# Patient Record
Sex: Male | Born: 1995 | Race: White | Hispanic: No | Marital: Single | Smoking: Former smoker
Health system: Southern US, Community
[De-identification: ages and names within clinical notes are randomized; demographics above are authoritative.]

## PROBLEM LIST (undated history)

## (undated) DIAGNOSIS — T8859XA Other complications of anesthesia, initial encounter: Secondary | ICD-10-CM

## (undated) DIAGNOSIS — F419 Anxiety disorder, unspecified: Secondary | ICD-10-CM

## (undated) DIAGNOSIS — Z8489 Family history of other specified conditions: Secondary | ICD-10-CM

## (undated) DIAGNOSIS — F329 Major depressive disorder, single episode, unspecified: Secondary | ICD-10-CM

## (undated) DIAGNOSIS — T4145XA Adverse effect of unspecified anesthetic, initial encounter: Secondary | ICD-10-CM

## (undated) DIAGNOSIS — F32A Depression, unspecified: Secondary | ICD-10-CM

## (undated) DIAGNOSIS — K429 Umbilical hernia without obstruction or gangrene: Secondary | ICD-10-CM

## (undated) HISTORY — DX: Umbilical hernia without obstruction or gangrene: K42.9

---

## 2017-03-10 ENCOUNTER — Emergency Department
Admission: EM | Admit: 2017-03-10 | Discharge: 2017-03-10 | Disposition: A | Discharge status: Home / Self Care | Payer: Self-pay | Attending: Emergency Medicine | Admitting: Emergency Medicine

## 2017-03-10 ENCOUNTER — Emergency Department: Payer: Self-pay

## 2017-03-10 ENCOUNTER — Encounter: Payer: Self-pay | Admitting: Emergency Medicine

## 2017-03-10 DIAGNOSIS — R1033 Periumbilical pain: Secondary | ICD-10-CM | POA: Insufficient documentation

## 2017-03-10 DIAGNOSIS — R112 Nausea with vomiting, unspecified: Secondary | ICD-10-CM | POA: Insufficient documentation

## 2017-03-10 DIAGNOSIS — R61 Generalized hyperhidrosis: Secondary | ICD-10-CM | POA: Insufficient documentation

## 2017-03-10 DIAGNOSIS — K429 Umbilical hernia without obstruction or gangrene: Secondary | ICD-10-CM | POA: Insufficient documentation

## 2017-03-10 LAB — CBC
HCT: 49.1 % (ref 40.0–52.0)
Hemoglobin: 17.1 g/dL (ref 13.0–18.0)
MCH: 28 pg (ref 26.0–34.0)
MCHC: 34.8 g/dL (ref 32.0–36.0)
MCV: 80.5 fL (ref 80.0–100.0)
PLATELETS: 319 10*3/uL (ref 150–440)
RBC: 6.1 MIL/uL — AB (ref 4.40–5.90)
RDW: 13.2 % (ref 11.5–14.5)
WBC: 17.7 10*3/uL — ABNORMAL HIGH (ref 3.8–10.6)

## 2017-03-10 LAB — COMPREHENSIVE METABOLIC PANEL
ALK PHOS: 56 U/L (ref 38–126)
ALT: 34 U/L (ref 17–63)
ANION GAP: 10 (ref 5–15)
AST: 31 U/L (ref 15–41)
Albumin: 4.7 g/dL (ref 3.5–5.0)
BILIRUBIN TOTAL: 1.3 mg/dL — AB (ref 0.3–1.2)
BUN: 15 mg/dL (ref 6–20)
CALCIUM: 9.5 mg/dL (ref 8.9–10.3)
CO2: 25 mmol/L (ref 22–32)
CREATININE: 0.9 mg/dL (ref 0.61–1.24)
Chloride: 101 mmol/L (ref 101–111)
Glucose, Bld: 106 mg/dL — ABNORMAL HIGH (ref 65–99)
Potassium: 3.8 mmol/L (ref 3.5–5.1)
Sodium: 136 mmol/L (ref 135–145)
TOTAL PROTEIN: 8.1 g/dL (ref 6.5–8.1)

## 2017-03-10 LAB — URINALYSIS, COMPLETE (UACMP) WITH MICROSCOPIC
Bacteria, UA: NONE SEEN
Bilirubin Urine: NEGATIVE
Glucose, UA: NEGATIVE mg/dL
Hgb urine dipstick: NEGATIVE
KETONES UR: 20 mg/dL — AB
Leukocytes, UA: NEGATIVE
Nitrite: NEGATIVE
PH: 7 (ref 5.0–8.0)
Protein, ur: 30 mg/dL — AB
SQUAMOUS EPITHELIAL / LPF: NONE SEEN
Specific Gravity, Urine: 1.028 (ref 1.005–1.030)

## 2017-03-10 LAB — LIPASE, BLOOD: Lipase: 16 U/L (ref 11–51)

## 2017-03-10 MED ORDER — ONDANSETRON 4 MG PO TBDP: 4.0000 mg | ORAL_TABLET | Freq: Three times a day (TID) | ORAL | 0 refills | Status: DC | PRN

## 2017-03-10 MED ORDER — FENTANYL CITRATE (PF) 100 MCG/2ML IJ SOLN
50.0000 ug | Freq: Once | INTRAMUSCULAR | Status: DC
  Filled 2017-03-10: qty 2

## 2017-03-10 MED ORDER — SODIUM CHLORIDE 0.9 % IV BOLUS (SEPSIS)
1000.0000 mL | Freq: Once | INTRAVENOUS | Status: AC
  Administered 2017-03-10: 1000 mL via INTRAVENOUS

## 2017-03-10 MED ORDER — ONDANSETRON HCL 4 MG/2ML IJ SOLN
4.0000 mg | Freq: Once | INTRAMUSCULAR | Status: AC
  Administered 2017-03-10: 4 mg via INTRAVENOUS
  Filled 2017-03-10: qty 2

## 2017-03-10 MED ORDER — IOPAMIDOL (ISOVUE-300) INJECTION 61%
30.0000 mL | Freq: Once | INTRAVENOUS | Status: AC | PRN
  Administered 2017-03-10: 30 mL via ORAL

## 2017-03-10 MED ORDER — IOPAMIDOL (ISOVUE-300) INJECTION 61%
100.0000 mL | Freq: Once | INTRAVENOUS | Status: AC | PRN
  Administered 2017-03-10: 100 mL via INTRAVENOUS

## 2017-03-10 NOTE — Discharge Instructions (Signed)
Drink plenty of fluid to stay well-hydrated. You may take Tylenol or Motrin for pain. Zofran as for nausea.  Please make a follow-up appointment with Dr. Excell Seltzerooper, general surgeon, to have your hernia reevaluated. If you develop severe pain, nausea or vomiting, fever, or any other symptoms concerning to you, return to the emergency department.

## 2017-03-10 NOTE — ED Provider Notes (Signed)
[facility] Emergency Department Provider Note  ____________________________________________  Time seen: Approximately 1:05 PM  I have reviewed the triage vital signs and the nursing notes.   HISTORY  Chief Complaint Abdominal Pain    HPI Keith Davis is a 21 y.o. male otherwise healthy and without any history of abdominal surgery presenting with periumbilical pain, nausea and vomiting. The patient reports that he was on a ride on mower at 7:30 this morning when he developed a sharp pain just behind the umbilicus. Since then, he has had multiple similar episodes. He then became clammy, diaphoretic but denies constipation or diarrhea, dysuria, or pain of the scrotum, testicles or penis.  Tried Zofran without significant improvement.   History reviewed. No pertinent past medical history.  There are no active problems to display for this patient.   History reviewed. No pertinent surgical history.    Allergies Patient has no known allergies.  No family history on file.  Social History Social History  Substance Use Topics  . Smoking status: Never Smoker  . Smokeless tobacco: Never Used  . Alcohol use No    Review of Systems Constitutional: No fever/chills. No lightheadedness or syncope. Positive diaphoresis. Eyes: No visual changes. ENT: No sore throat. No congestion or rhinorrhea. Cardiovascular: Denies chest pain. Denies palpitations. Respiratory: Denies shortness of breath.  No cough. Gastrointestinal: positive periumbilical abdominal pain.  No nausea, no vomiting.  No diarrhea.  No constipation. Genitourinary: Negative for dysuria.no scrotal, testicular, or penile pain. Musculoskeletal: Negative for back pain. Skin: Negative for rash. Neurological: Negative for headaches. No focal numbness, tingling or weakness.     ____________________________________________   PHYSICAL EXAM:  VITAL SIGNS: ED Triage Vitals [03/10/17 1202]  Enc  Vitals Group     BP 119/71     Pulse Rate 71     Resp 15     Temp 98.2 F (36.8 C)     Temp Source Oral     SpO2 100 %     Weight 215 lb (97.5 kg)     Height 5\' 10"  (1.778 m)     Head Circumference      Peak Flow      Pain Score 5     Pain Loc      Pain Edu?      Excl. in GC?     Constitutional: Alert and oriented. Well appearing and in no acute distress. Answers questions appropriately. Eyes: Conjunctivae are normal.  EOMI. No scleral icterus. Head: Atraumatic. Nose: No congestion/rhinnorhea. Mouth/Throat: Mucous membranes are moist.  Neck: No stridor.  Supple.   Cardiovascular: Normal rate, regular rhythm. No murmurs, rubs or gallops.  Respiratory: Normal respiratory effort.  No accessory muscle use or retractions. Lungs CTAB.  No wheezes, rales or ronchi. Gastrointestinal: Soft, and nondistended.  Tender to palpation in the right lower quadrant greater than the right upper quadrant. No reproducible tenderness around the umbilicus.No guarding or rebound.  No peritoneal signs. Genitourinary: deferred as the patient denies symptoms Musculoskeletal: No LE edema.  Neurologic:  A&Ox3.  Speech is clear.  Face and smile are symmetric.  EOMI.  Moves all extremities well. Skin:  Skin is warm, dry and intact. No rash noted. Psychiatric: Mood and affect are normal. Speech and behavior are normal.  Normal judgement.  ____________________________________________   LABS (all labs ordered are listed, but only abnormal results are displayed)  Labs Reviewed  COMPREHENSIVE METABOLIC PANEL - Abnormal; Notable for the following:       Result Value  Glucose, Bld 106 (*)    Total Bilirubin 1.3 (*)    All other components within normal limits  CBC - Abnormal; Notable for the following:    WBC 17.7 (*)    RBC 6.10 (*)    All other components within normal limits  URINALYSIS, COMPLETE (UACMP) WITH MICROSCOPIC - Abnormal; Notable for the following:    Color, Urine YELLOW (*)    APPearance  CLEAR (*)    Ketones, ur 20 (*)    Protein, ur 30 (*)    All other components within normal limits  LIPASE, BLOOD   ____________________________________________  EKG  Not indicated ____________________________________________  RADIOLOGY  Ct Abdomen Pelvis W Contrast  Result Date: 03/10/2017 CLINICAL DATA:  Umbilical pain EXAM: CT ABDOMEN AND PELVIS WITH CONTRAST TECHNIQUE: Multidetector CT imaging of the abdomen and pelvis was performed using the standard protocol following bolus administration of intravenous contrast. CONTRAST:  ISOVUE-300 IOPAMIDOL (ISOVUE-300) INJECTION 61% COMPARISON:  None. FINDINGS: LOWER CHEST: Lung bases are clear. Included heart size is normal. No pericardial effusion. HEPATOBILIARY: Liver and gallbladder are normal. PANCREAS: Normal. SPLEEN: Normal. ADRENALS/URINARY TRACT: Kidneys are orthotopic, demonstrating symmetric enhancement. No nephrolithiasis, hydronephrosis or solid renal masses. The unopacified ureters are normal in course and caliber. Delayed imaging through the kidneys demonstrates symmetric prompt contrast excretion within the proximal urinary collecting system. Urinary bladder is partially distended and unremarkable. Normal adrenal glands. STOMACH/BOWEL: The stomach, small and large bowel are normal in course and caliber without inflammatory changes. Normal appendix. VASCULAR/LYMPHATIC: Aortoiliac vessels are normal in course and caliber. No lymphadenopathy by CT size criteria. REPRODUCTIVE: Normal. OTHER: No intraperitoneal free fluid or free air. Tiny umbilical fat containing hernia. MUSCULOSKELETAL: Nonacute. IMPRESSION: 1. No acute intraabdominal nor pelvic abnormality. 2. No bowel obstruction or inflammation. 3. Normal appendix. 4. Tiny fat containing umbilical hernia. Electronically Signed   By: Tollie Eth M.D.   On: 03/10/2017 14:32    ____________________________________________   PROCEDURES  Procedure(s) performed:  None  Procedures  Critical Care performed: No ____________________________________________   INITIAL IMPRESSION / ASSESSMENT AND PLAN / ED COURSE  Pertinent labs & imaging results that were available during my care of the patient were reviewed by me and considered in my medical decision making (see chart for details).  21 y.o. male, otherwise healthy, presenting with periumbilical pain, now in the right lower quadrant associated with nausea and vomiting.We'll give CT scan to rule out appendicitis. Consider gas,early GI infection, and rule out UTI. Plan to initiate symptomatic treament. Plan reevaluation for final disposition.  ----------------------------------------- 2:49 PM on 03/10/2017 -----------------------------------------  The patient's evaluation in the emergency department is overall reassuring. He does have an elevated white blood cell count, but symptomatically, he has significant improved. His pain has resolved and he is able to tolerate liquids without vomiting. His CT scan does not show appendicitis; he does have a small fat containing umbilical hernia but on reexamination he does not have any pain there and incarceration of the fat is very unlikely. I will plan to discharge him home with [facility] you follow-up. I've given him return precautions as well as follow-up instructions.  ____________________________________________  FINAL CLINICAL IMPRESSION(S) / ED DIAGNOSES  Final diagnoses:  Periumbilical pain  Non-intractable vomiting with nausea, unspecified vomiting type  Diaphoresis  Umbilical hernia without obstruction and without gangrene         NEW MEDICATIONS STARTED DURING THIS VISIT:  New Prescriptions   ONDANSETRON (ZOFRAN ODT) 4 MG DISINTEGRATING TABLET    Take 1  tablet (4 mg total) by mouth every 8 (eight) hours as needed for nausea or vomiting.      Rockne Menghini, MD 03/10/17 1451

## 2017-03-10 NOTE — ED Notes (Signed)
Visitor came to nurses desk stating pt finished contrast. CT notified.

## 2017-03-10 NOTE — ED Triage Notes (Signed)
Patient presents to ED via POV from work c/o umbilical pain. Patient states he was mowing grass when he developed the sudden pain. Patient also reports diarrhea. Heel drop negative for any pain.

## 2017-03-11 ENCOUNTER — Encounter: Payer: Self-pay | Admitting: Surgery

## 2017-03-11 ENCOUNTER — Ambulatory Visit (INDEPENDENT_AMBULATORY_CARE_PROVIDER_SITE_OTHER): Payer: Self-pay | Admitting: Surgery

## 2017-03-11 VITALS — BP 162/71 | HR 71 | Temp 98.3°F | Ht 70.0 in | Wt 215.0 lb

## 2017-03-11 DIAGNOSIS — K429 Umbilical hernia without obstruction or gangrene: Secondary | ICD-10-CM

## 2017-03-11 NOTE — Progress Notes (Signed)
Surgical Consultation  03/11/2017  Keith Davis is an 21 y.o. male.   Chief Complaint  Patient presents with  . Follow-up    Periumbilical pain     HPI: Keith Davis is a 21 year old male otherwise healthy seen in the ER yesterday and refer for evaluation after he presented with severe abdominal pain starting yesterday around the periumbilical area. The pain was constant and sharp in nature. The Patient Never Had Any Previous Operations. Now the Pa pain has improved. And the nausea has subsided. I have personally reviewed his CT scan of the abdomen and pelvis showing a small umbilical hernia defect. No evidence of bowel incarceration. No other acute intra-abdominal abnormalities. Lab work was reviewed and noted. White count was elevated at that time. No evidence of bowel obstruction. He is passing gas and taking by mouth  Past Medical History:  Diagnosis Date  . Periumbilical hernia     Past Surgical History:  Procedure Laterality Date  . NO PAST SURGERIES      Family History  Problem Relation Age of Onset  . Healthy Mother     Social History:  reports that he has never smoked. He has never used smokeless tobacco. He reports that he does not drink alcohol or use drugs.  Allergies: No Known Allergies  Medications reviewed.     ROS Full ROS performed and is otherwise negative other than what is stated in the HPI    BP (!) 162/71   Pulse 71   Temp 98.3 F (36.8 C) (Oral)   Ht _0  (1.778 m)   Wt 97.5 kg (215 lb)   BMI 30.85 kg/m   Physical Exam  Constitutional: He is oriented to person, place, and time and well-developed, well-nourished, and in no distress.  Eyes: Right eye exhibits no discharge. Left eye exhibits no discharge. No scleral icterus.  Neck: Normal range of motion. No JVD present. No tracheal deviation present.  Cardiovascular: Normal rate, regular rhythm and normal heart sounds.   Pulmonary/Chest: Effort normal. No stridor. No respiratory distress.  He has no wheezes.  Abdominal: Soft. He exhibits no distension and no mass. There is no rebound and no guarding.  Small umbilical hernia defect measured about 1.5 cm. There is tenderness to palpation in this area. No peritonitis  Musculoskeletal: Normal range of motion. He exhibits no edema.  Neurological: He is alert and oriented to person, place, and time. Gait normal. GCS score is 15.  Skin: Skin is warm and dry. He is not diaphoretic.  Psychiatric: Mood, memory, affect and judgment normal.  Nursing note and vitals reviewed.     Results for orders placed or performed during the hospital encounter of 03/10/17 (from the past 48 hour(s))  Lipase, blood     Status: None   Collection Time: 03/10/17 12:09 PM  Result Value Ref Range   Lipase 16 11 - 51 U/L  Comprehensive metabolic panel     Status: Abnormal   Collection Time: 03/10/17 12:09 PM  Result Value Ref Range   Sodium 136 135 - 145 mmol/L   Potassium 3.8 3.5 - 5.1 mmol/L   Chloride 101 101 - 111 mmol/L   CO2 25 22 - 32 mmol/L   Glucose, Bld 106 (H) 65 - 99 mg/dL   BUN 15 6 - 20 mg/dL   Creatinine, Ser 0.90 0.61 - 1.24 mg/dL   Calcium 9.5 8.9 - 10.3 mg/dL   Total Protein 8.1 6.5 - 8.1 g/dL   Albumin 4.7 3.5 - 5.0 g/dL  AST 31 15 - 41 U/L   ALT 34 17 - 63 U/L   Alkaline Phosphatase 56 38 - 126 U/L   Total Bilirubin 1.3 (H) 0.3 - 1.2 mg/dL   GFR calc non Af Amer >60 >60 mL/min   GFR calc Af Amer >60 >60 mL/min    Comment: (NOTE) The eGFR has been calculated using the CKD EPI equation. This calculation has not been validated in all clinical situations. eGFR's persistently <60 mL/min signify possible Chronic Kidney Disease.    Anion gap 10 5 - 15  CBC     Status: Abnormal   Collection Time: 03/10/17 12:09 PM  Result Value Ref Range   WBC 17.7 (H) 3.8 - 10.6 K/uL   RBC 6.10 (H) 4.40 - 5.90 MIL/uL   Hemoglobin 17.1 13.0 - 18.0 g/dL   HCT 49.1 40.0 - 52.0 %   MCV 80.5 80.0 - 100.0 fL   MCH 28.0 26.0 - 34.0 pg   MCHC  34.8 32.0 - 36.0 g/dL   RDW 13.2 11.5 - 14.5 %   Platelets 319 150 - 440 K/uL  Urinalysis, Complete w Microscopic     Status: Abnormal   Collection Time: 03/10/17 12:09 PM  Result Value Ref Range   Color, Urine YELLOW (A) YELLOW   APPearance CLEAR (A) CLEAR   Specific Gravity, Urine 1.028 1.005 - 1.030   pH 7.0 5.0 - 8.0   Glucose, UA NEGATIVE NEGATIVE mg/dL   Hgb urine dipstick NEGATIVE NEGATIVE   Bilirubin Urine NEGATIVE NEGATIVE   Ketones, ur 20 (A) NEGATIVE mg/dL   Protein, ur 30 (A) NEGATIVE mg/dL   Nitrite NEGATIVE NEGATIVE   Leukocytes, UA NEGATIVE NEGATIVE   RBC / HPF 0-5 0 - 5 RBC/hpf   WBC, UA 0-5 0 - 5 WBC/hpf   Bacteria, UA NONE SEEN NONE SEEN   Squamous Epithelial / LPF NONE SEEN NONE SEEN   Mucus PRESENT    Ct Abdomen Pelvis W Contrast  Result Date: 03/10/2017 CLINICAL DATA:  Umbilical pain EXAM: CT ABDOMEN AND PELVIS WITH CONTRAST TECHNIQUE: Multidetector CT imaging of the abdomen and pelvis was performed using the standard protocol following bolus administration of intravenous contrast. CONTRAST:  138m ISOVUE-300 IOPAMIDOL (ISOVUE-300) INJECTION 61% COMPARISON:  None. FINDINGS: LOWER CHEST: Lung bases are clear. Included heart size is normal. No pericardial effusion. HEPATOBILIARY: Liver and gallbladder are normal. PANCREAS: Normal. SPLEEN: Normal. ADRENALS/URINARY TRACT: Kidneys are orthotopic, demonstrating symmetric enhancement. No nephrolithiasis, hydronephrosis or solid renal masses. The unopacified ureters are normal in course and caliber. Delayed imaging through the kidneys demonstrates symmetric prompt contrast excretion within the proximal urinary collecting system. Urinary bladder is partially distended and unremarkable. Normal adrenal glands. STOMACH/BOWEL: The stomach, small and large bowel are normal in course and caliber without inflammatory changes. Normal appendix. VASCULAR/LYMPHATIC: Aortoiliac vessels are normal in course and caliber. No lymphadenopathy by  CT size criteria. REPRODUCTIVE: Normal. OTHER: No intraperitoneal free fluid or free air. Tiny umbilical fat containing hernia. MUSCULOSKELETAL: Nonacute. IMPRESSION: 1. No acute intraabdominal nor pelvic abnormality. 2. No bowel obstruction or inflammation. 3. Normal appendix. 4. Tiny fat containing umbilical hernia. Electronically Signed   By: DAshley RoyaltyM.D.   On: 03/10/2017 14:32    Assessment/Plan: 1. Umbilical hernia without obstruction and without gangrene Symptomatic umbilical hernia on a 21year old otherwise healthy. Discussed with the patient in detail about his disease process. Given his symptoms I do recommend elective repair. Discussed with the patient in detail about the procedure. Risk, benefits and possible  complications including but not limited to: Bleeding, infection, recurrence. He understands and wishes to proceed. We will schedule him for an open umbilical hernia repair and possible mesh placement   Caroleen Hamman, MD [place] Surgeon

## 2017-03-11 NOTE — Patient Instructions (Signed)
Please look at your blue sheet in case you have any questions or concerns about your surgery. Remember that Dr. Everlene FarrierPabon will be doing your surgery on 03/25/2017 for a Laparoscopic umbilical hernia repair.

## 2017-03-12 ENCOUNTER — Telehealth: Payer: Self-pay | Admitting: Surgery

## 2017-03-12 NOTE — Telephone Encounter (Signed)
Patient has received all surgical information.

## 2017-03-12 NOTE — Telephone Encounter (Signed)
I have called patient to advise him of the surgery information below. No answer. I have left a message on voicemail.    pre op date/time and sx date. Sx: 03/25/17 with Dr Pabon--Open umbilical hernia repair possible mesh.  Pre op: 03/19/17 between 9-1:00pm--phone.   Patient made aware to call [phone], between 1-3:00pm the day before surgery, to find out what time to arrive.

## 2017-03-19 ENCOUNTER — Encounter
Admission: RE | Admit: 2017-03-19 | Discharge: 2017-03-19 | Disposition: A | Discharge status: Home / Self Care | Payer: Self-pay | Source: Ambulatory Visit | Attending: Surgery | Admitting: Surgery

## 2017-03-19 HISTORY — DX: Depression, unspecified: F32.A

## 2017-03-19 HISTORY — DX: Family history of other specified conditions: Z84.89

## 2017-03-19 HISTORY — DX: Anxiety disorder, unspecified: F41.9

## 2017-03-19 HISTORY — DX: Major depressive disorder, single episode, unspecified: F32.9

## 2017-03-19 NOTE — Patient Instructions (Signed)
  Your procedure is scheduled on: 03-25-17 Report to Same Day Surgery 2nd floor medical mall [facility](Medical Mall Entrance-take elevator on left to 2nd floor.  Check in with surgery information desk.) To find out your arrival time please call [phone] between 1PM - 3PM on 03-24-17  Remember: Instructions that are not followed completely may result in serious medical risk, up to and including death, or upon the discretion of your surgeon and anesthesiologist your surgery may need to be rescheduled.    _x___ 1. Do not eat food after midnight the night before your procedure. You may drink clear liquids up to 2 hours before you are scheduled to arrive at the hospital for your procedure.  Do not drink clear liquids within 2 hours of your scheduled arrival to the hospital.  Clear liquids include  --Water or Apple juice without pulp  --Clear carbohydrate beverage such as ClearFast or Gatorade  --Black Coffee or Clear Tea (No milk, no creamers, do not add anything to the coffee or Tea Type 1 and type 2 diabetics should only drink water.  No gum chewing or hard candies.     __x__ 2. No Alcohol for 24 hours before or after surgery.   __x__3. No Smoking for 24 prior to surgery.   ____  4. Bring all medications with you on the day of surgery if instructed.    __x__ 5. Notify your doctor if there is any change in your medical condition     (cold, fever, infections).     Do not wear jewelry, make-up, hairpins, clips or nail polish.  Do not wear lotions, powders, or perfumes. You may wear deodorant.  Do not shave 48 hours prior to surgery. Men may shave face and neck.  Do not bring valuables to the hospital.    [facility] belongings or valuables.               Contacts, dentures or bridgework may not be worn into surgery.  Leave your suitcase in the car. After surgery it may be brought to your room.  For patients admitted to the hospital, discharge time is determined by your  treatment team.   Patients discharged the day of surgery will not be allowed to drive home.  You will need someone to drive you home and stay with you the night of your procedure.    Please read over the following fact sheets that you were given:     ____ Take anti-hypertensive listed below, cardiac, seizure, asthma,     anti-reflux and psychiatric medicines. These include:  1. NONE  2.  3.  4.  5.  6.  ____Fleets enema or Magnesium Citrate as directed.   ____ Use CHG Soap or sage wipes as directed on instruction sheet   ____ Use inhalers on the day of surgery and bring to hospital day of surgery  ____ Stop Metformin and Janumet 2 days prior to surgery.    ____ Take 1/2 of usual insulin dose the night before surgery and none on the morning     surgery.   ____ Follow recommendations from Cardiologist, Pulmonologist or PCP regarding stopping Aspirin, Coumadin, Plavix ,Eliquis, Effient, or Pradaxa, and Pletal.  X____Stop Anti-inflammatories such as Advil, Aleve, Ibuprofen, Motrin, Naproxen, Naprosyn, Goodies powders or aspirin products NOW-OK to take Tylenol    ____ Stop supplements until after surgery.   ____ Bring C-Pap to the hospital.

## 2017-03-24 MED ORDER — CEFAZOLIN SODIUM-DEXTROSE 2-4 GM/100ML-% IV SOLN
2.0000 g | INTRAVENOUS | Status: AC
  Administered 2017-03-25: 2 g via INTRAVENOUS

## 2017-03-25 ENCOUNTER — Ambulatory Visit: Payer: Self-pay | Admitting: Anesthesiology

## 2017-03-25 ENCOUNTER — Encounter: Payer: Self-pay | Admitting: *Deleted

## 2017-03-25 ENCOUNTER — Ambulatory Visit
Admission: RE | Admit: 2017-03-25 | Discharge: 2017-03-25 | Disposition: A | Discharge status: Home / Self Care | Payer: Self-pay | Source: Ambulatory Visit | Attending: Surgery | Admitting: Surgery

## 2017-03-25 ENCOUNTER — Encounter
Admission: RE | Disposition: A | Discharge status: Home / Self Care | Payer: Self-pay | Source: Ambulatory Visit | Attending: Surgery

## 2017-03-25 DIAGNOSIS — K429 Umbilical hernia without obstruction or gangrene: Secondary | ICD-10-CM

## 2017-03-25 HISTORY — PX: UMBILICAL HERNIA REPAIR: SHX196

## 2017-03-25 SURGERY — REPAIR, HERNIA, UMBILICAL, ADULT
Anesthesia: General | Site: Abdomen | Wound class: Clean

## 2017-03-25 MED ORDER — BUPIVACAINE-EPINEPHRINE (PF) 0.25% -1:200000 IJ SOLN
INTRAMUSCULAR | Status: AC
  Filled 2017-03-25: qty 30

## 2017-03-25 MED ORDER — SUGAMMADEX SODIUM 200 MG/2ML IV SOLN
INTRAVENOUS | Status: AC
  Filled 2017-03-25: qty 2

## 2017-03-25 MED ORDER — FAMOTIDINE 20 MG PO TABS
ORAL_TABLET | ORAL | Status: AC
  Administered 2017-03-25: 20 mg via ORAL
  Filled 2017-03-25: qty 1

## 2017-03-25 MED ORDER — CHLORHEXIDINE GLUCONATE CLOTH 2 % EX PADS: 6.0000 | MEDICATED_PAD | Freq: Once | CUTANEOUS | Status: DC

## 2017-03-25 MED ORDER — DEXAMETHASONE SODIUM PHOSPHATE 10 MG/ML IJ SOLN
INTRAMUSCULAR | Status: DC | PRN
Start: 2017-03-25 — End: 2017-03-25
  Administered 2017-03-25: 10 mg via INTRAVENOUS

## 2017-03-25 MED ORDER — LACTATED RINGERS IV SOLN
INTRAVENOUS | Status: DC
  Administered 2017-03-25 (×2): via INTRAVENOUS

## 2017-03-25 MED ORDER — FAMOTIDINE 20 MG PO TABS
20.0000 mg | ORAL_TABLET | Freq: Once | ORAL | Status: AC
  Administered 2017-03-25: 20 mg via ORAL

## 2017-03-25 MED ORDER — ACETAMINOPHEN 10 MG/ML IV SOLN
INTRAVENOUS | Status: DC | PRN
  Administered 2017-03-25: 1000 mg via INTRAVENOUS

## 2017-03-25 MED ORDER — FENTANYL CITRATE (PF) 100 MCG/2ML IJ SOLN
INTRAMUSCULAR | Status: AC
  Filled 2017-03-25: qty 2

## 2017-03-25 MED ORDER — ROCURONIUM BROMIDE 50 MG/5ML IV SOLN
INTRAVENOUS | Status: AC
  Filled 2017-03-25: qty 1

## 2017-03-25 MED ORDER — KETOROLAC TROMETHAMINE 30 MG/ML IJ SOLN
INTRAMUSCULAR | Status: DC | PRN
  Administered 2017-03-25: 30 mg via INTRAVENOUS

## 2017-03-25 MED ORDER — ROCURONIUM BROMIDE 100 MG/10ML IV SOLN
INTRAVENOUS | Status: DC | PRN
  Administered 2017-03-25: 5 mg via INTRAVENOUS
  Administered 2017-03-25: 15 mg via INTRAVENOUS

## 2017-03-25 MED ORDER — HYDROCODONE-ACETAMINOPHEN 5-325 MG PO TABS: 1.0000 | ORAL_TABLET | Freq: Four times a day (QID) | ORAL | 0 refills | Status: DC | PRN

## 2017-03-25 MED ORDER — ONDANSETRON HCL 4 MG/2ML IJ SOLN: 4.0000 mg | Freq: Once | INTRAMUSCULAR | Status: DC | PRN

## 2017-03-25 MED ORDER — BUPIVACAINE-EPINEPHRINE 0.25% -1:200000 IJ SOLN
INTRAMUSCULAR | Status: DC | PRN
  Administered 2017-03-25: 30 mL

## 2017-03-25 MED ORDER — MIDAZOLAM HCL 2 MG/2ML IJ SOLN
INTRAMUSCULAR | Status: DC | PRN
  Administered 2017-03-25: 2 mg via INTRAVENOUS

## 2017-03-25 MED ORDER — FENTANYL CITRATE (PF) 100 MCG/2ML IJ SOLN
INTRAMUSCULAR | Status: DC | PRN
  Administered 2017-03-25 (×2): 100 ug via INTRAVENOUS

## 2017-03-25 MED ORDER — PROPOFOL 10 MG/ML IV BOLUS
INTRAVENOUS | Status: AC
  Filled 2017-03-25: qty 20

## 2017-03-25 MED ORDER — SUGAMMADEX SODIUM 200 MG/2ML IV SOLN
INTRAVENOUS | Status: DC | PRN
  Administered 2017-03-25: 200 mg via INTRAVENOUS

## 2017-03-25 MED ORDER — ONDANSETRON HCL 4 MG/2ML IJ SOLN
INTRAMUSCULAR | Status: DC | PRN
  Administered 2017-03-25: 4 mg via INTRAVENOUS

## 2017-03-25 MED ORDER — FENTANYL CITRATE (PF) 250 MCG/5ML IJ SOLN
INTRAMUSCULAR | Status: AC
  Filled 2017-03-25: qty 5

## 2017-03-25 MED ORDER — DEXAMETHASONE SODIUM PHOSPHATE 10 MG/ML IJ SOLN
INTRAMUSCULAR | Status: AC
  Filled 2017-03-25: qty 1

## 2017-03-25 MED ORDER — ONDANSETRON HCL 4 MG/2ML IJ SOLN
INTRAMUSCULAR | Status: AC
  Filled 2017-03-25: qty 2

## 2017-03-25 MED ORDER — FENTANYL CITRATE (PF) 100 MCG/2ML IJ SOLN: 25.0000 ug | INTRAMUSCULAR | Status: DC | PRN

## 2017-03-25 MED ORDER — MIDAZOLAM HCL 2 MG/2ML IJ SOLN
INTRAMUSCULAR | Status: AC
  Filled 2017-03-25: qty 2

## 2017-03-25 MED ORDER — PROPOFOL 500 MG/50ML IV EMUL
INTRAVENOUS | Status: AC
  Filled 2017-03-25: qty 50

## 2017-03-25 MED ORDER — SUCCINYLCHOLINE CHLORIDE 20 MG/ML IJ SOLN
INTRAMUSCULAR | Status: AC
  Filled 2017-03-25: qty 1

## 2017-03-25 MED ORDER — SUCCINYLCHOLINE CHLORIDE 20 MG/ML IJ SOLN
INTRAMUSCULAR | Status: DC | PRN
  Administered 2017-03-25: 120 mg via INTRAVENOUS

## 2017-03-25 MED ORDER — LIDOCAINE 2% (20 MG/ML) 5 ML SYRINGE
INTRAMUSCULAR | Status: DC | PRN
  Administered 2017-03-25: 100 mg via INTRAVENOUS

## 2017-03-25 MED ORDER — PROPOFOL 10 MG/ML IV BOLUS
INTRAVENOUS | Status: DC | PRN
  Administered 2017-03-25: 200 mg via INTRAVENOUS

## 2017-03-25 MED ORDER — CEFAZOLIN SODIUM-DEXTROSE 2-4 GM/100ML-% IV SOLN
INTRAVENOUS | Status: AC
  Filled 2017-03-25: qty 100

## 2017-03-25 SURGICAL SUPPLY — 22 items
APPLIER CLIP 11 MED OPEN (CLIP)
BLADE CLIPPER SURG (BLADE) ×3 IMPLANT
CANISTER SUCT 1200ML W/VALVE (MISCELLANEOUS) ×3 IMPLANT
CHLORAPREP W/TINT 26ML (MISCELLANEOUS) ×3 IMPLANT
CLIP APPLIE 11 MED OPEN (CLIP) IMPLANT
DERMABOND ADVANCED (GAUZE/BANDAGES/DRESSINGS) ×2
DERMABOND ADVANCED .7 DNX12 (GAUZE/BANDAGES/DRESSINGS) ×1 IMPLANT
DRAPE INCISE IOBAN 66X45 STRL (DRAPES) ×3 IMPLANT
DRAPE PED LAPAROTOMY (DRAPES) ×3 IMPLANT
DRSG TELFA 3X8 NADH (GAUZE/BANDAGES/DRESSINGS) IMPLANT
ELECT REM PT RETURN 9FT ADLT (ELECTROSURGICAL) ×3
ELECTRODE REM PT RTRN 9FT ADLT (ELECTROSURGICAL) ×1 IMPLANT
GLOVE BIO SURGEON STRL SZ7 (GLOVE) ×3 IMPLANT
GOWN STRL REUS W/ TWL LRG LVL3 (GOWN DISPOSABLE) ×2 IMPLANT
GOWN STRL REUS W/TWL LRG LVL3 (GOWN DISPOSABLE) ×4
NEEDLE HYPO 22GX1.5 SAFETY (NEEDLE) ×3 IMPLANT
NS IRRIG 500ML POUR BTL (IV SOLUTION) ×3 IMPLANT
PACK BASIN MINOR ARMC (MISCELLANEOUS) ×3 IMPLANT
SPONGE LAP 18X18 5 PK (GAUZE/BANDAGES/DRESSINGS) ×3 IMPLANT
SUT ETHIBOND NAB MO 7 #0 18IN (SUTURE) IMPLANT
SUT MNCRL AB 4-0 PS2 18 (SUTURE) ×3 IMPLANT
SYR 20CC LL (SYRINGE) ×3 IMPLANT

## 2017-03-25 NOTE — Op Note (Signed)
PROCEDURES: Primary repair of umbilical hernia  Pre-operative Diagnosis: Umbilical Hernia  Post-operative Diagnosis: Same  Surgeon: Merri Rayiego F Mika Griffitts    Anesthesia: General endotracheal anesthesia  ASA Class: 1   Surgeon: Sterling Bigiego Devita Nies , MD FACS  Anesthesia: Gen. with endotracheal tube  Findings: 1 cm UH reducible   Estimated Blood Loss: 5cc                  Complications: none                Condition: stable  Procedure Details  The patient was seen again in the Holding Room. The benefits, complications, treatment options, and expected outcomes were discussed with the patient. The risks of bleeding, infection, recurrence of symptoms, failure to resolve symptoms,  bowel injury, any of which could require further surgery were reviewed with the patient.   The patient was taken to Operating Room, identified as Diannia RuderSteven Flewellen and the procedure verified.  A Time Out was held and the above information confirmed.  Prior to the induction of general anesthesia, antibiotic prophylaxis was administered. VTE prophylaxis was in place. General endotracheal anesthesia was then administered and tolerated well. After the induction, the abdomen was prepped with Chloraprep and draped in the sterile fashion. The patient was positioned in the supine position.  The umbilical incision was created over the hernia and electrocautery was used to dissect through subcutaneous tissue. The hernia sac was dissected free from adjacent tissue and fascia. The hernia sac was entered and the sac was excised. Care was taken to avoid any injury to the bowel. The defect was repaired using interrupted 0 Ethibond sutures in the standard fashion. The subcutaneous tissue was closed with 3-0 Vicryl and skin was closed with a 4-0 Monocryl in a subcuticular fashion. Dermabond was used to coat the skin. Lidocaine 1% with epinephrine and Marcaine quarter percent was used to inject all the incision sites. Patient tolerated procedure  well and there were no immediate complications. Needle and laparotomy counts were correct  Sterling Bigiego Lucas Exline, MD, FACS

## 2017-03-25 NOTE — Anesthesia Procedure Notes (Signed)
Procedure Name: Intubation Date/Time: 03/25/2017 1:09 PM Performed by: Paulette BlanchPARAS, Moranda Billiot Pre-anesthesia Checklist: Patient identified, Patient being monitored, Timeout performed, Emergency Drugs available and Suction available Patient Re-evaluated:Patient Re-evaluated prior to induction Oxygen Delivery Method: Circle system utilized Preoxygenation: Pre-oxygenation with 100% oxygen Induction Type: IV induction Ventilation: Mask ventilation without difficulty Laryngoscope Size: 3 and Miller Grade View: Grade I Tube type: Oral Tube size: 7.5 mm Number of attempts: 1 Placement Confirmation: ETT inserted through vocal cords under direct vision,  positive ETCO2 and breath sounds checked- equal and bilateral Secured at: 21 cm Tube secured with: Tape Dental Injury: Teeth and Oropharynx as per pre-operative assessment

## 2017-03-25 NOTE — Anesthesia Post-op Follow-up Note (Signed)
Anesthesia QCDR form completed.        

## 2017-03-25 NOTE — Interval H&P Note (Signed)
History and Physical Interval Note:  03/25/2017 12:31 PM  Keith RuderSteven Gaw  has presented today for surgery, with the diagnosis of umbilical hernia  The various methods of treatment have been discussed with the patient and family. After consideration of risks, benefits and other options for treatment, the patient has consented to  Procedure(s): HERNIA REPAIR UMBILICAL ADULT (N/A) as a surgical intervention .  The patient's history has been reviewed, patient examined, no change in status, stable for surgery.  I have reviewed the patient's chart and labs.  Questions were answered to the patient's satisfaction.     Kaeleen Odom F Josiyah Tozzi

## 2017-03-25 NOTE — Anesthesia Preprocedure Evaluation (Signed)
Anesthesia Evaluation  Patient identified by MRN, date of birth, ID band Patient awake    Reviewed: Allergy & Precautions, H&P , NPO status , Patient's Chart, lab work & pertinent test results, reviewed documented beta blocker date and time   Airway Mallampati: II  TM Distance: >3 FB Neck ROM: full    Dental  (+) Teeth Intact   Pulmonary neg pulmonary ROS, former smoker,    Pulmonary exam normal        Cardiovascular negative cardio ROS Normal cardiovascular exam Rhythm:regular Rate:Normal     Neuro/Psych negative neurological ROS  negative psych ROS   GI/Hepatic negative GI ROS, Neg liver ROS,   Endo/Other  negative endocrine ROS  Renal/GU negative Renal ROS  negative genitourinary   Musculoskeletal   Abdominal   Peds  Hematology negative hematology ROS (+)   Anesthesia Other Findings Past Medical History: No date: Anxiety     Comment:  AS A CHILD No date: Depression     Comment:  AS A CHILD No date: Family history of adverse reaction to anesthesia     Comment:  PT'S SISTER- PER PT "IT TAKES ALOT OF ANESTHESIA TO PUT               HER TO SLEEP" AND SISTER HAS WOKEN UP DURING SURGERY-  PT              HAS ONLY HAD DENTAL PROCEDURES DONE AND IT TAKES A LOT OF              NOVACAINE TO NUMB PT No date: Periumbilical hernia Past Surgical History: No date: NO PAST SURGERIES BMI    Body Mass Index:  30.85 kg/m     Reproductive/Obstetrics negative OB ROS                             Anesthesia Physical Anesthesia Plan  ASA: II  Anesthesia Plan: General ETT   Post-op Pain Management:    Induction:   PONV Risk Score and Plan: 3 and Ondansetron, Dexamethasone, Midazolam and Propofol infusion  Airway Management Planned:   Additional Equipment:   Intra-op Plan:   Post-operative Plan:   Informed Consent: I have reviewed the patients History and Physical, chart, labs and  discussed the procedure including the risks, benefits and alternatives for the proposed anesthesia with the patient or authorized representative who has indicated his/her understanding and acceptance.   Dental Advisory Given  Plan Discussed with: CRNA  Anesthesia Plan Comments:         Anesthesia Quick Evaluation

## 2017-03-25 NOTE — Discharge Instructions (Signed)

## 2017-03-25 NOTE — H&P (View-Only) (Signed)
Surgical Consultation  03/11/2017  Keith Davis is an 21 y.o. male.   Chief Complaint  Patient presents with  . Follow-up    Periumbilical pain     HPI: Keith Davis is a 21-year-old male otherwise healthy seen in the ER yesterday and refer for evaluation after he presented with severe abdominal pain starting yesterday around the periumbilical area. The pain was constant and sharp in nature. The Patient Never Had Any Previous Operations. Now the Pa pain has improved. And the nausea has subsided. I have personally reviewed his CT scan of the abdomen and pelvis showing a small umbilical hernia defect. No evidence of bowel incarceration. No other acute intra-abdominal abnormalities. Lab work was reviewed and noted. White count was elevated at that time. No evidence of bowel obstruction. He is passing gas and taking by mouth  Past Medical History:  Diagnosis Date  . Periumbilical hernia     Past Surgical History:  Procedure Laterality Date  . NO PAST SURGERIES      Family History  Problem Relation Age of Onset  . Healthy Mother     Social History:  reports that he has never smoked. He has never used smokeless tobacco. He reports that he does not drink alcohol or use drugs.  Allergies: No Known Allergies  Medications reviewed.     ROS Full ROS performed and is otherwise negative other than what is stated in the HPI    BP (!) 162/71   Pulse 71   Temp 98.3 F (36.8 C) (Oral)   Ht 5' 10" (1.778 m)   Wt 97.5 kg (215 lb)   BMI 30.85 kg/m   Physical Exam  Constitutional: He is oriented to person, place, and time and well-developed, well-nourished, and in no distress.  Eyes: Right eye exhibits no discharge. Left eye exhibits no discharge. No scleral icterus.  Neck: Normal range of motion. No JVD present. No tracheal deviation present.  Cardiovascular: Normal rate, regular rhythm and normal heart sounds.   Pulmonary/Chest: Effort normal. No stridor. No respiratory distress.  He has no wheezes.  Abdominal: Soft. He exhibits no distension and no mass. There is no rebound and no guarding.  Small umbilical hernia defect measured about 1.5 cm. There is tenderness to palpation in this area. No peritonitis  Musculoskeletal: Normal range of motion. He exhibits no edema.  Neurological: He is alert and oriented to person, place, and time. Gait normal. GCS score is 15.  Skin: Skin is warm and dry. He is not diaphoretic.  Psychiatric: Mood, memory, affect and judgment normal.  Nursing note and vitals reviewed.     Results for orders placed or performed during the hospital encounter of 03/10/17 (from the past 48 hour(s))  Lipase, blood     Status: None   Collection Time: 03/10/17 12:09 PM  Result Value Ref Range   Lipase 16 11 - 51 U/L  Comprehensive metabolic panel     Status: Abnormal   Collection Time: 03/10/17 12:09 PM  Result Value Ref Range   Sodium 136 135 - 145 mmol/L   Potassium 3.8 3.5 - 5.1 mmol/L   Chloride 101 101 - 111 mmol/L   CO2 25 22 - 32 mmol/L   Glucose, Bld 106 (H) 65 - 99 mg/dL   BUN 15 6 - 20 mg/dL   Creatinine, Ser 0.90 0.61 - 1.24 mg/dL   Calcium 9.5 8.9 - 10.3 mg/dL   Total Protein 8.1 6.5 - 8.1 g/dL   Albumin 4.7 3.5 - 5.0 g/dL     AST 31 15 - 41 U/L   ALT 34 17 - 63 U/L   Alkaline Phosphatase 56 38 - 126 U/L   Total Bilirubin 1.3 (H) 0.3 - 1.2 mg/dL   GFR calc non Af Amer >60 >60 mL/min   GFR calc Af Amer >60 >60 mL/min    Comment: (NOTE) The eGFR has been calculated using the CKD EPI equation. This calculation has not been validated in all clinical situations. eGFR's persistently <60 mL/min signify possible Chronic Kidney Disease.    Anion gap 10 5 - 15  CBC     Status: Abnormal   Collection Time: 03/10/17 12:09 PM  Result Value Ref Range   WBC 17.7 (H) 3.8 - 10.6 K/uL   RBC 6.10 (H) 4.40 - 5.90 MIL/uL   Hemoglobin 17.1 13.0 - 18.0 g/dL   HCT 49.1 40.0 - 52.0 %   MCV 80.5 80.0 - 100.0 fL   MCH 28.0 26.0 - 34.0 pg   MCHC  34.8 32.0 - 36.0 g/dL   RDW 13.2 11.5 - 14.5 %   Platelets 319 150 - 440 K/uL  Urinalysis, Complete w Microscopic     Status: Abnormal   Collection Time: 03/10/17 12:09 PM  Result Value Ref Range   Color, Urine YELLOW (A) YELLOW   APPearance CLEAR (A) CLEAR   Specific Gravity, Urine 1.028 1.005 - 1.030   pH 7.0 5.0 - 8.0   Glucose, UA NEGATIVE NEGATIVE mg/dL   Hgb urine dipstick NEGATIVE NEGATIVE   Bilirubin Urine NEGATIVE NEGATIVE   Ketones, ur 20 (A) NEGATIVE mg/dL   Protein, ur 30 (A) NEGATIVE mg/dL   Nitrite NEGATIVE NEGATIVE   Leukocytes, UA NEGATIVE NEGATIVE   RBC / HPF 0-5 0 - 5 RBC/hpf   WBC, UA 0-5 0 - 5 WBC/hpf   Bacteria, UA NONE SEEN NONE SEEN   Squamous Epithelial / LPF NONE SEEN NONE SEEN   Mucus PRESENT    Ct Abdomen Pelvis W Contrast  Result Date: 03/10/2017 CLINICAL DATA:  Umbilical pain EXAM: CT ABDOMEN AND PELVIS WITH CONTRAST TECHNIQUE: Multidetector CT imaging of the abdomen and pelvis was performed using the standard protocol following bolus administration of intravenous contrast. CONTRAST:  100mL ISOVUE-300 IOPAMIDOL (ISOVUE-300) INJECTION 61% COMPARISON:  None. FINDINGS: LOWER CHEST: Lung bases are clear. Included heart size is normal. No pericardial effusion. HEPATOBILIARY: Liver and gallbladder are normal. PANCREAS: Normal. SPLEEN: Normal. ADRENALS/URINARY TRACT: Kidneys are orthotopic, demonstrating symmetric enhancement. No nephrolithiasis, hydronephrosis or solid renal masses. The unopacified ureters are normal in course and caliber. Delayed imaging through the kidneys demonstrates symmetric prompt contrast excretion within the proximal urinary collecting system. Urinary bladder is partially distended and unremarkable. Normal adrenal glands. STOMACH/BOWEL: The stomach, small and large bowel are normal in course and caliber without inflammatory changes. Normal appendix. VASCULAR/LYMPHATIC: Aortoiliac vessels are normal in course and caliber. No lymphadenopathy by  CT size criteria. REPRODUCTIVE: Normal. OTHER: No intraperitoneal free fluid or free air. Tiny umbilical fat containing hernia. MUSCULOSKELETAL: Nonacute. IMPRESSION: 1. No acute intraabdominal nor pelvic abnormality. 2. No bowel obstruction or inflammation. 3. Normal appendix. 4. Tiny fat containing umbilical hernia. Electronically Signed   By: David  Kwon M.D.   On: 03/10/2017 14:32    Assessment/Plan: 1. Umbilical hernia without obstruction and without gangrene Symptomatic umbilical hernia on a 21-year-old otherwise healthy. Discussed with the patient in detail about his disease process. Given his symptoms I do recommend elective repair. Discussed with the patient in detail about the procedure. Risk, benefits and possible   complications including but not limited to: Bleeding, infection, recurrence. He understands and wishes to proceed. We will schedule him for an open umbilical hernia repair and possible mesh placement   Nussen Pullin, MD FACS General Surgeon  

## 2017-03-25 NOTE — Transfer of Care (Signed)
Immediate Anesthesia Transfer of Care Note  Patient: Keith RuderSteven Barham  Procedure(s) Performed: Procedure(s): HERNIA REPAIR UMBILICAL ADULT (N/A)  Patient Location: PACU  Anesthesia Type:General  Level of Consciousness: awake, alert  and oriented  Airway & Oxygen Therapy: Patient Spontanous Breathing and Patient connected to face mask oxygen  Post-op Assessment: Report given to RN and Post -op Vital signs reviewed and stable  Post vital signs: Reviewed and stable  Last Vitals:  Vitals:   03/25/17 1146  BP: 119/64  Pulse: 65  Resp: 18  Temp: 36.7 C  SpO2: 99%    Last Pain:  Vitals:   03/25/17 1146  TempSrc: Oral  PainSc: 0-No pain         Complications: No apparent anesthesia complications

## 2017-03-25 NOTE — Anesthesia Procedure Notes (Signed)
Procedure Name: Intubation Date/Time: 03/25/2017 1:09 PM Performed by: Paulette BlanchPARAS, Codi Folkerts Pre-anesthesia Checklist: Patient identified, Patient being monitored, Timeout performed, Emergency Drugs available and Suction available Patient Re-evaluated:Patient Re-evaluated prior to induction Oxygen Delivery Method: Circle system utilized Preoxygenation: Pre-oxygenation with 100% oxygen Induction Type: IV induction Ventilation: Mask ventilation without difficulty Laryngoscope Size: 3 and Miller Grade View: Grade I Tube type: Oral Tube size: 7.5 mm Number of attempts: 1 Airway Equipment and Method: Stylet Placement Confirmation: ETT inserted through vocal cords under direct vision,  positive ETCO2 and breath sounds checked- equal and bilateral Secured at: 21 cm Tube secured with: Tape Dental Injury: Teeth and Oropharynx as per pre-operative assessment

## 2017-03-26 ENCOUNTER — Encounter: Payer: Self-pay | Admitting: Surgery

## 2017-03-29 NOTE — Anesthesia Postprocedure Evaluation (Signed)
Anesthesia Post Note  Patient: Keith Davis  Procedure(s) Performed: Procedure(s) (LRB): HERNIA REPAIR UMBILICAL ADULT (N/A)  Patient location during evaluation: PACU Anesthesia Type: General Level of consciousness: awake and alert Pain management: pain level controlled Vital Signs Assessment: post-procedure vital signs reviewed and stable Respiratory status: spontaneous breathing, nonlabored ventilation, respiratory function stable and patient connected to nasal cannula oxygen Cardiovascular status: blood pressure returned to baseline and stable Postop Assessment: no apparent nausea or vomiting Anesthetic complications: no     Last Vitals:  Vitals:   03/25/17 1443 03/25/17 1500  BP: (!) 111/58 108/61  Pulse: (!) 59   Resp: 20   Temp: 36.6 C   SpO2: 100%     Last Pain:  Vitals:   03/25/17 1443  TempSrc:   PainSc: 1                  Yevette Edwards

## 2017-04-08 ENCOUNTER — Ambulatory Visit (INDEPENDENT_AMBULATORY_CARE_PROVIDER_SITE_OTHER): Payer: Self-pay | Admitting: Surgery

## 2017-04-08 ENCOUNTER — Encounter: Payer: Self-pay | Admitting: Surgery

## 2017-04-08 VITALS — BP 127/80 | HR 80 | Temp 98.1°F | Ht 70.0 in | Wt 213.4 lb

## 2017-04-08 DIAGNOSIS — K429 Umbilical hernia without obstruction or gangrene: Secondary | ICD-10-CM

## 2017-04-08 NOTE — Progress Notes (Signed)
Surgical Clinic Progress/Follow-up Note   HPI:  21 y.o. Male presents to clinic for post-op follow-up evaluation 2 weeks s/p open primary repair of symptomatic umbilical hernia. Patient reports nearly complete resolution of his peri-incisional abdominal pain, for which he has not required narcotic pain medication, and describes +flatus and +BM WNL, denies N/V, fever/chills, CP, or SOB. He has also remained out of work at [employer] that requires routine heavy lifting.  Review of Systems:  Constitutional: denies any other weight loss, fever, chills, or sweats  Eyes: denies any other vision changes, history of eye injury  ENT: denies sore throat, hearing problems  Respiratory: denies shortness of breath, wheezing  Cardiovascular: denies chest pain, palpitations  Gastrointestinal: abdominal pain, N/V, and bowel function as per HPI Musculoskeletal: denies any other joint pains or cramps  Skin: Denies any other rashes or skin discolorations  Neurological: denies any other headache, dizziness, weakness  Psychiatric: denies any other depression, anxiety  All other review of systems: otherwise negative   Vital Signs:  BP 127/80   Pulse 80   Temp 98.1 F (36.7 C) (Oral)   Ht  (1.778 m)   Wt 213 lb 6.4 oz (96.8 kg)   BMI 30.62 kg/m    Physical Exam:  Constitutional:  -- Normal body habitus  -- Awake, alert, and oriented x3  Eyes:  -- Pupils equally round and reactive to light  -- No scleral icterus  Ear, nose, throat:  -- No jugular venous distension  -- No nasal drainage, bleeding Pulmonary:  -- No crackles -- Equal breath sounds bilaterally -- Breathing non-labored at rest Cardiovascular:  -- S1, S2 present  -- No pericardial rubs  Gastrointestinal:  -- Soft, nontender, non-distended, no guarding/rebound  -- Incision well-approximated without erythema or drainage, glue remains intact -- No abdominal masses appreciated, pulsatile or otherwise   Musculoskeletal / Integumentary:  -- Wounds or skin discoloration: None except post-surgical abdominal incision as above  -- Extremities: B/L UE and LE FROM, hands and feet warm, no edema  Neurologic:  -- Motor function: intact and symmetric  -- Sensation: intact and symmetric   Assessment:  21 y.o. yo Male with a problem list including...  Patient Active Problem List   Diagnosis Date Noted  . Umbilical hernia without obstruction and without gangrene     presents to clinic for post-op follow-up evaluation, doing well s/p open primary repair of symptomatic 1 cm umbilical hernia.  Plan:   - avoid heavy lifting >40 lbs x 4 more weeks   - okay to shower and submerge incision under water prn  - instructed to call office if any questions or concerns  - return to clinic as needed  All of the above recommendations were discussed with the patient and patient's wife, and all of patient's and family's questions were answered to his expressed satisfaction.  -- Scherrie Gerlach Earlene Plater, MD, RPVI [place]: [facility] General [facility] - Partnering for exceptional care. Office: [phone]

## 2017-04-08 NOTE — Patient Instructions (Signed)

## 2017-05-13 ENCOUNTER — Telehealth: Payer: Self-pay

## 2017-05-13 NOTE — Telephone Encounter (Signed)
Patient called wanting a letter to return to work on 05/17/2017 with no restrictions. Since patient had his surgery on 03/25/2017, he is able to return to work with no restrictions. I told patient that he is able to pick up the letter and that it would be left at the front desk for pick up. Patient agreed and had no further questions.

## 2018-07-18 ENCOUNTER — Ambulatory Visit (INDEPENDENT_AMBULATORY_CARE_PROVIDER_SITE_OTHER): Payer: Self-pay | Admitting: Surgery

## 2018-07-18 ENCOUNTER — Other Ambulatory Visit: Payer: Self-pay

## 2018-07-18 ENCOUNTER — Encounter: Payer: Self-pay | Admitting: *Deleted

## 2018-07-18 ENCOUNTER — Telehealth: Payer: Self-pay | Admitting: *Deleted

## 2018-07-18 ENCOUNTER — Encounter: Payer: Self-pay | Admitting: Surgery

## 2018-07-18 VITALS — BP 117/73 | HR 60 | Temp 99.3°F | Ht 70.0 in | Wt 218.6 lb

## 2018-07-18 DIAGNOSIS — K439 Ventral hernia without obstruction or gangrene: Secondary | ICD-10-CM

## 2018-07-18 NOTE — H&P (View-Only) (Signed)
Patient ID: Keith Davis, male   DOB: Jan 11, 1996, 23 y.o.   MRN: [id number]  HPI Keith Davis is a 23 y.o. male comes to the office for periumbilical pain that started at work while doing some strenuous activity. He reported severe pain that Friday . Sharp intermittent and on the periumbilical area, now feels a bulge. No fevers, chills, N/V. HE is able to perform more than 4 METS w/o sob or c/p. HE did have a hx UH repair primary 03/2017 and did well.   HPI  Past Medical History:  Diagnosis Date  . Anxiety    AS A CHILD  . Depression    AS A CHILD  . Family history of adverse reaction to anesthesia    PT'S SISTER- PER PT "IT TAKES ALOT OF ANESTHESIA TO PUT HER TO SLEEP" AND SISTER HAS WOKEN UP DURING SURGERY-  PT HAS ONLY HAD DENTAL PROCEDURES DONE AND IT TAKES A LOT OF NOVACAINE TO NUMB PT  . Periumbilical hernia     Past Surgical History:  Procedure Laterality Date  . NO PAST SURGERIES    . UMBILICAL HERNIA REPAIR N/A 03/25/2017   Procedure: HERNIA REPAIR UMBILICAL ADULT;  Surgeon: Leafy Ro, MD;  Location: [facility] ORS;  Service: General;  Laterality: N/A;    Family History  Problem Relation Age of Onset  . Healthy Mother     Social History Social History   Tobacco Use  . Smoking status: Former Smoker    Packs/day: 2.00    Years: 4.00    Pack years: 8.00    Last attempt to quit: 03/20/2015    Years since quitting: 3.3  . Smokeless tobacco: Never Used  Substance Use Topics  . Alcohol use: No  . Drug use: No    Allergies  Allergen Reactions  . Lactose Intolerance (Gi)     No current outpatient medications on file.   No current facility-administered medications for this visit.      Review of Systems Full ROS  was asked and was negative except for the information on the HPI  Physical Exam Blood pressure 117/73, pulse 60, temperature 99.3 F (37.4 C), temperature source Temporal, height 5\' 10"  (1.778 m), weight 218 lb 9.6 oz (99.2 kg), SpO2 96  %. CONSTITUTIONAL:NAD. EYES: Pupils are equal, round, and reactive to light, Sclera are non-icteric. EARS, NOSE, MOUTH AND THROAT: The oropharynx is clear. The oral mucosa is pink and moist. Hearing is intact to voice. LYMPH NODES:  Lymph nodes in the neck are normal. RESPIRATORY:  Lungs are clear. There is normal respiratory effort, with equal breath sounds bilaterally, and without pathologic use of accessory muscles. CARDIOVASCULAR: Heart is regular without murmurs, gallops, or rubs. GI: The abdomen is soft, mild TTP with a small epigastric hernia that is reducible but tender. Previous lower umbilical scar is well healed and there is no recurrence on the original site There are no palpable masses. There is no hepatosplenomegaly. There are normal bowel sounds in all quadrants. GU: Rectal deferred.   MUSCULOSKELETAL: Normal muscle strength and tone. No cyanosis or edema.   SKIN: Turgor is good and there are no pathologic skin lesions or ulcers. NEUROLOGIC: Motor and sensation is grossly normal. Cranial nerves are grossly intact. PSYCH:  Oriented to person, place and time. Affect is normal.  Data Reviewed  I have personally reviewed the patient's imaging, laboratory findings and medical records.    Assessment/Plan Ventral epigastric hernia vs recurrent UH liekly related to strenuous activity at work. Given  his symptoms I would recommend repair and mesh placement. I do think he is a good candidate for robotic ventral hernia repair. D/W the pt in detail about the procedure, risks, benefits and possible complications including but not limited to bleeding, infection, recurrence and bowel injuries. HE understands and wishes to proceed. We will plan for rob Ventral hernia repair.  Sterling Big, MD FACS General Surgeon 07/18/2018, 5:06 PM

## 2018-07-18 NOTE — Addendum Note (Signed)
Addended by: Sterling Big F on: 07/18/2018 05:13 PM   Modules accepted: Orders, SmartSet

## 2018-07-18 NOTE — Patient Instructions (Addendum)
Patient needs to be scheduled for surgery with Dr.Pabon. Will call the office after speaking with his supervisor.   Call the office with any questions or concerns.  Ventral Hernia  A ventral hernia is a bulge of tissue from inside the abdomen that pushes through a weak area of the muscles that form the front wall of the abdomen. The tissues inside the abdomen are inside a sac (peritoneum). These tissues include the small intestine, large intestine, and the fatty tissue that covers the intestines (omentum). Sometimes, the bulge that forms a hernia contains intestines. Other hernias contain only fat. Ventral hernias do not go away without surgical treatment. There are several types of ventral hernias. You may have:  A hernia at an incision site from previous abdominal surgery (incisional hernia).  A hernia just above the belly button (epigastric hernia), or at the belly button (umbilical hernia). These types of hernias can develop from heavy lifting or straining.  A hernia that comes and goes (reducible hernia). It may be visible only when you lift or strain. This type of hernia can be pushed back into the abdomen (reduced).  A hernia that traps abdominal tissue inside the hernia (incarcerated hernia). This type of hernia does not reduce.  A hernia that cuts off blood flow to the tissues inside the hernia (strangulated hernia). The tissues can start to die if this happens. This is a very painful bulge that cannot be reduced. A strangulated hernia is a medical emergency. What are the causes? This condition is caused by abdominal tissue putting pressure on an area of weakness in the abdominal muscles. What increases the risk? The following factors may make you more likely to develop this condition:  Being male.  Being 43 or older.  Being overweight or obese.  Having had previous abdominal surgery, especially if there was an infection after surgery.  Having had an injury to the abdominal  wall.  Having had several pregnancies.  Having a buildup of fluid inside the abdomen (ascites). What are the signs or symptoms? The only symptom of a ventral hernia may be a painless bulge in the abdomen. A reducible hernia may be visible only when you strain, cough, or lift. Other symptoms may include:  Dull pain.  A feeling of pressure. Signs and symptoms of a strangulated hernia may include:  Increasing pain.  Nausea and vomiting.  Pain when pressing on the hernia.  The skin over the hernia turning red or purple.  Constipation.  Blood in the stool (feces). How is this diagnosed? This condition may be diagnosed based on:  Your symptoms.  Your medical history.  A physical exam. You may be asked to cough or strain while standing. These actions increase the pressure inside your abdomen and force the hernia through the opening in your muscles. Your health care provider may try to reduce the hernia by pressing on it.  Imaging studies, such as an ultrasound or CT scan. How is this treated? This condition is treated with surgery. If you have a strangulated hernia, surgery is done as soon as possible. If your hernia is small and not incarcerated, you may be asked to lose some weight before surgery. Follow these instructions at home:  Follow instructions from your health care provider about eating or drinking restrictions.  If you are overweight, your health care provider may recommend that you increase your activity level and eat a healthier diet.  Do not lift anything that is heavier than 10 lb (4.5 kg).  Return to your normal activities as told by your health care provider. Ask your health care provider what activities are safe for you. You may need to avoid activities that increase pressure on your hernia.  Take over-the-counter and prescription medicines only as told by your health care provider.  Keep all follow-up visits as told by your health care provider. This is  important. Contact a health care provider if:  Your hernia gets larger.  Your hernia becomes painful. Get help right away if:  Your hernia becomes increasingly painful.  You have pain along with any of the following: ? Changes in skin color in the area of the hernia. ? Nausea. ? Vomiting. ? Fever. Summary  A ventral hernia is a bulge of tissue from inside the abdomen that pushes through a weak area of the muscles that form the front wall of the abdomen.  This condition is treated with surgery, which may be urgent depending on your hernia.  Do not lift anything that is heavier than 10 lb (4.5 kg), and follow activity instructions from your health care provider. This information is not intended to replace advice given to you by your health care provider. Make sure you discuss any questions you have with your health care provider. Document Released: 06/15/2012 Document Revised: 08/11/2017 Document Reviewed: 01/18/2017 Elsevier Interactive Patient Education  2019 [employer] ncerns

## 2018-07-18 NOTE — Progress Notes (Signed)
Patient ID: Keith Davis, male   DOB: Jan 11, 1996, 23 y.o.   MRN: [id number]  HPI Keith Davis is a 23 y.o. male comes to the office for periumbilical pain that started at work while doing some strenuous activity. He reported severe pain that Friday . Sharp intermittent and on the periumbilical area, now feels a bulge. No fevers, chills, N/V. HE is able to perform more than 4 METS w/o sob or c/p. HE did have a hx UH repair primary 03/2017 and did well.   HPI  Past Medical History:  Diagnosis Date  . Anxiety    AS A CHILD  . Depression    AS A CHILD  . Family history of adverse reaction to anesthesia    PT'S SISTER- PER PT "IT TAKES ALOT OF ANESTHESIA TO PUT HER TO SLEEP" AND SISTER HAS WOKEN UP DURING SURGERY-  PT HAS ONLY HAD DENTAL PROCEDURES DONE AND IT TAKES A LOT OF NOVACAINE TO NUMB PT  . Periumbilical hernia     Past Surgical History:  Procedure Laterality Date  . NO PAST SURGERIES    . UMBILICAL HERNIA REPAIR N/A 03/25/2017   Procedure: HERNIA REPAIR UMBILICAL ADULT;  Surgeon: Leafy Ro, MD;  Location: [facility] ORS;  Service: General;  Laterality: N/A;    Family History  Problem Relation Age of Onset  . Healthy Mother     Social History Social History   Tobacco Use  . Smoking status: Former Smoker    Packs/day: 2.00    Years: 4.00    Pack years: 8.00    Last attempt to quit: 03/20/2015    Years since quitting: 3.3  . Smokeless tobacco: Never Used  Substance Use Topics  . Alcohol use: No  . Drug use: No    Allergies  Allergen Reactions  . Lactose Intolerance (Gi)     No current outpatient medications on file.   No current facility-administered medications for this visit.      Review of Systems Full ROS  was asked and was negative except for the information on the HPI  Physical Exam Blood pressure 117/73, pulse 60, temperature 99.3 F (37.4 C), temperature source Temporal, height 5\' 10"  (1.778 m), weight 218 lb 9.6 oz (99.2 kg), SpO2 96  %. CONSTITUTIONAL:NAD. EYES: Pupils are equal, round, and reactive to light, Sclera are non-icteric. EARS, NOSE, MOUTH AND THROAT: The oropharynx is clear. The oral mucosa is pink and moist. Hearing is intact to voice. LYMPH NODES:  Lymph nodes in the neck are normal. RESPIRATORY:  Lungs are clear. There is normal respiratory effort, with equal breath sounds bilaterally, and without pathologic use of accessory muscles. CARDIOVASCULAR: Heart is regular without murmurs, gallops, or rubs. GI: The abdomen is soft, mild TTP with a small epigastric hernia that is reducible but tender. Previous lower umbilical scar is well healed and there is no recurrence on the original site There are no palpable masses. There is no hepatosplenomegaly. There are normal bowel sounds in all quadrants. GU: Rectal deferred.   MUSCULOSKELETAL: Normal muscle strength and tone. No cyanosis or edema.   SKIN: Turgor is good and there are no pathologic skin lesions or ulcers. NEUROLOGIC: Motor and sensation is grossly normal. Cranial nerves are grossly intact. PSYCH:  Oriented to person, place and time. Affect is normal.  Data Reviewed  I have personally reviewed the patient's imaging, laboratory findings and medical records.    Assessment/Plan Ventral epigastric hernia vs recurrent UH liekly related to strenuous activity at work. Given  his symptoms I would recommend repair and mesh placement. I do think he is a good candidate for robotic ventral hernia repair. D/W the pt in detail about the procedure, risks, benefits and possible complications including but not limited to bleeding, infection, recurrence and bowel injuries. HE understands and wishes to proceed. We will plan for rob Ventral hernia repair.  Christine Morton, MD FACS General Surgeon 07/18/2018, 5:06 PM   

## 2018-07-18 NOTE — Telephone Encounter (Signed)
Patient called the office stating that he was ready to get surgery scheduled.   The patient states he contacted his employer and is trying to get Workman's Comp.  Patient is aware that he will need to let us know if this gets approved; otherwise, patient is aware that he will be self pay.   Patient's surgery to be scheduled for 08-04-18 at [facility] with Dr. Everlene Farrier.  The patient is aware he will be contacted by the Pre-Admission Testing Department to complete a phone interview sometime in the near future.  The patient states he is driving and wishes to call back at a later time to review surgery instructions further.

## 2018-07-18 NOTE — Progress Notes (Signed)
Patient to contact the office if he wishes to proceed with robotic ventral hernia repair with Dr. Everlene Farrier.

## 2018-07-20 ENCOUNTER — Ambulatory Visit: Payer: Self-pay | Admitting: Surgery

## 2018-07-21 ENCOUNTER — Telehealth: Payer: Self-pay | Admitting: Surgery

## 2018-07-21 NOTE — Telephone Encounter (Signed)
I told the patient we would ask Dr Everlene Farrier how long he needed to be out of work and about being out before surgery, since he does heavy lifting and shoveling which causes more pain, he is aware Dr Everlene Farrier will be in the office on Monday and he said that was ok-it could wait until then.

## 2018-07-21 NOTE — Telephone Encounter (Signed)
Patiens wife is calling and stated the patient would need a doctors note for work letting them know he can't work before and after surgery. Patients wife has a fax number that the letter needs to go to the first fax number is [phone] and attn: Aggie Cosier and the other one to fax to is  [phone] Lynnea Ferrier. Please call patient and advise.

## 2018-07-21 NOTE — Telephone Encounter (Signed)
It is ok for him to be out until surgery and aware there is a 6 week max 20 pound weight restriction after surgery, letter faxed, pt agrees and aware.

## 2018-07-27 ENCOUNTER — Telehealth: Payer: Self-pay | Admitting: *Deleted

## 2018-07-27 NOTE — Telephone Encounter (Signed)
Patient called the office regarding upcoming surgery on 08-04-18 with Dr. Everlene Farrier. He is scheduled for a robotic ventral hernia repair.   The patient was originally trying to get workman's comp through his employer but this has been denied.   Patient is requesting a cost estimate of surgery-CPT 49560.  Message to Angie to call patient with the information.   He is aware he will need to pay something down towards his surgery if able.

## 2018-08-01 NOTE — Telephone Encounter (Signed)
I have spoke with patient about the provider estimate for surgery. Patient is self pay at this time.  Provider estimate is $818.00  Patient states that he can pay 500.00 prior to surgery on 08/04/18. He states that his parents live close by the office and will come by and make a payment.   I did apologize for the delay and patient was ok.   I have left a copy of the estimate at the front desk.

## 2018-08-01 NOTE — Telephone Encounter (Signed)
Patient called and needs a cost estimate of surgery that is coming up on Thursday. Patient would like to have this information no later than tomorrow because they have to pay something down towards surgery and they would like to know the amount. Patient is frustrated with not hearing anything back from [place].

## 2018-08-02 ENCOUNTER — Inpatient Hospital Stay: Admission: RE | Admit: 2018-08-02 | Payer: Self-pay | Source: Ambulatory Visit

## 2018-08-03 ENCOUNTER — Other Ambulatory Visit: Payer: Self-pay

## 2018-08-03 ENCOUNTER — Encounter
Admission: RE | Admit: 2018-08-03 | Discharge: 2018-08-03 | Disposition: A | Discharge status: Home / Self Care | Payer: Self-pay | Source: Ambulatory Visit | Attending: Surgery | Admitting: Surgery

## 2018-08-03 HISTORY — DX: Adverse effect of unspecified anesthetic, initial encounter: T41.45XA

## 2018-08-03 HISTORY — DX: Other complications of anesthesia, initial encounter: T88.59XA

## 2018-08-03 MED ORDER — CEFAZOLIN SODIUM-DEXTROSE 2-4 GM/100ML-% IV SOLN
2.0000 g | INTRAVENOUS | Status: AC
  Administered 2018-08-04: 2 g via INTRAVENOUS

## 2018-08-03 NOTE — Patient Instructions (Signed)
Your procedure is scheduled on: 08-04-18 Report to Same Day Surgery 2nd floor medical mall (Medical Mall Entrance-take elevator on left to 2nd floor.  Check in with surgery information desk.) To find out your arrival time please call [phone] between 1PM - 3PM on 08-03-18  Remember: Instructions that are not followed completely may result in serious medical risk, up to and including death, or upon the discretion of your surgeon and anesthesiologist your surgery may need to be rescheduled.    _x___ 1. Do not eat food after midnight the night before your procedure. You may drink clear liquids up to 2 hours before you are scheduled to arrive at the hospital for your procedure.  Do not drink clear liquids within 2 hours of your scheduled arrival to the hospital.  Clear liquids include  --Water or Apple juice without pulp  --Clear carbohydrate beverage such as ClearFast or Gatorade  --Black Coffee or Clear Tea (No milk, no creamers, do not add anything to the coffee or Tea   ____Ensure clear carbohydrate drink on the way to the hospital for bariatric patients  ____Ensure clear carbohydrate drink 3 hours before surgery for Dr Byrnett's patients if physician instructed.   No gum chewing or hard candies.     __x__ 2. No Alcohol for 24 hours before or after surgery.   __x__3. No Smoking or e-cigarettes for 24 prior to surgery.  Do not use any chewable tobacco products for at least 6 hour prior to surgery   ____  4. Bring all medications with you on the day of surgery if instructed.    __x__ 5. Notify your doctor if there is any change in your medical condition     (cold, fever, infections).    x___6. On the morning of surgery brush your teeth with toothpaste and water.  You may rinse your mouth with mouth wash if you wish.  Do not swallow any toothpaste or mouthwash.   Do not wear jewelry, make-up, hairpins, clips or nail polish.  Do not wear lotions, powders, or perfumes. You may wear  deodorant.  Do not shave 48 hours prior to surgery. Men may shave face and neck.  Do not bring valuables to the hospital.    [place] is not responsible for any belongings or valuables.               Contacts, dentures or bridgework may not be worn into surgery.  Leave your suitcase in the car. After surgery it may be brought to your room.  For patients admitted to the hospital, discharge time is determined by your treatment team.  _  Patients discharged the day of surgery will not be allowed to drive home.  You will need someone to drive you home and stay with you the night of your procedure.    Please read over the following fact sheets that you were given:   [place] Preparing for Surgery    ____ Take anti-hypertensive listed below, cardiac, seizure, asthma, anti-reflux and psychiatric medicines. These include:  1. NONE  2.  3.  4.  5.  6.  ____Fleets enema or Magnesium Citrate as directed.   ____ Use CHG Soap or sage wipes as directed on instruction sheet   ____ Use inhalers on the day of surgery and bring to hospital day of surgery  ____ Stop Metformin and Janumet 2 days prior to surgery.    ____ Take 1/2 of usual insulin dose the night before surgery and none   on the morning surgery.   ____ Follow recommendations from Cardiologist, Pulmonologist or PCP regarding stopping Aspirin, Coumadin, Plavix ,Eliquis, Effient, or Pradaxa, and Pletal.  X____Stop Anti-inflammatories such as Advil, Aleve, Ibuprofen, Motrin, Naproxen, Naprosyn, Goodies powders or aspirin products NOW-OK to take Tylenol    ____ Stop supplements until after surgery.     ____ Bring C-Pap to the hospital.    

## 2018-08-04 ENCOUNTER — Encounter
Admission: RE | Disposition: A | Discharge status: Home / Self Care | Payer: Self-pay | Source: Home / Self Care | Attending: Surgery

## 2018-08-04 ENCOUNTER — Other Ambulatory Visit: Payer: Self-pay

## 2018-08-04 ENCOUNTER — Ambulatory Visit: Payer: Medicaid Other | Admitting: Anesthesiology

## 2018-08-04 ENCOUNTER — Ambulatory Visit
Admission: RE | Admit: 2018-08-04 | Discharge: 2018-08-04 | Disposition: A | Discharge status: Home / Self Care | Payer: Medicaid Other | Attending: Surgery | Admitting: Surgery

## 2018-08-04 DIAGNOSIS — K439 Ventral hernia without obstruction or gangrene: Secondary | ICD-10-CM | POA: Insufficient documentation

## 2018-08-04 DIAGNOSIS — Z87891 Personal history of nicotine dependence: Secondary | ICD-10-CM | POA: Diagnosis not present

## 2018-08-04 DIAGNOSIS — Z9889 Other specified postprocedural states: Secondary | ICD-10-CM | POA: Insufficient documentation

## 2018-08-04 HISTORY — PX: ROBOTIC ASSISTED LAPAROSCOPIC VENTRAL/INCISIONAL HERNIA REPAIR: SHX6607

## 2018-08-04 SURGERY — ROBOTIC ASSISTED LAPAROSCOPIC VENTRAL/INCISIONAL HERNIA REPAIR
Anesthesia: General

## 2018-08-04 MED ORDER — HYDROCODONE-ACETAMINOPHEN 5-325 MG PO TABS: 1.0000 | ORAL_TABLET | Freq: Four times a day (QID) | ORAL | 0 refills | Status: DC | PRN

## 2018-08-04 MED ORDER — ROCURONIUM BROMIDE 100 MG/10ML IV SOLN
INTRAVENOUS | Status: DC | PRN
  Administered 2018-08-04: 30 mg via INTRAVENOUS
  Administered 2018-08-04: 50 mg via INTRAVENOUS

## 2018-08-04 MED ORDER — FENTANYL CITRATE (PF) 100 MCG/2ML IJ SOLN
25.0000 ug | INTRAMUSCULAR | Status: DC | PRN
  Administered 2018-08-04 (×2): 50 ug via INTRAVENOUS

## 2018-08-04 MED ORDER — SUGAMMADEX SODIUM 200 MG/2ML IV SOLN
INTRAVENOUS | Status: DC | PRN
  Administered 2018-08-04: 200 mg via INTRAVENOUS

## 2018-08-04 MED ORDER — CHLORHEXIDINE GLUCONATE CLOTH 2 % EX PADS: 6.0000 | MEDICATED_PAD | Freq: Once | CUTANEOUS | Status: DC

## 2018-08-04 MED ORDER — GABAPENTIN 300 MG PO CAPS
300.0000 mg | ORAL_CAPSULE | ORAL | Status: AC
  Administered 2018-08-04: 300 mg via ORAL

## 2018-08-04 MED ORDER — FENTANYL CITRATE (PF) 100 MCG/2ML IJ SOLN
INTRAMUSCULAR | Status: AC
  Administered 2018-08-04: 50 ug via INTRAVENOUS
  Filled 2018-08-04: qty 2

## 2018-08-04 MED ORDER — PROPOFOL 10 MG/ML IV BOLUS
INTRAVENOUS | Status: AC
  Filled 2018-08-04: qty 20

## 2018-08-04 MED ORDER — ACETAMINOPHEN 500 MG PO TABS
ORAL_TABLET | ORAL | Status: AC
  Administered 2018-08-04: 1000 mg via ORAL
  Filled 2018-08-04: qty 2

## 2018-08-04 MED ORDER — LIDOCAINE HCL (CARDIAC) PF 100 MG/5ML IV SOSY
PREFILLED_SYRINGE | INTRAVENOUS | Status: DC | PRN
  Administered 2018-08-04: 100 mg via INTRAVENOUS

## 2018-08-04 MED ORDER — ONDANSETRON HCL 4 MG/2ML IJ SOLN
INTRAMUSCULAR | Status: DC | PRN
  Administered 2018-08-04: 4 mg via INTRAVENOUS

## 2018-08-04 MED ORDER — OXYCODONE HCL 5 MG PO TABS
ORAL_TABLET | ORAL | Status: AC
  Administered 2018-08-04: 5 mg via ORAL
  Filled 2018-08-04: qty 1

## 2018-08-04 MED ORDER — BUPIVACAINE-EPINEPHRINE 0.25% -1:200000 IJ SOLN
INTRAMUSCULAR | Status: DC | PRN
  Administered 2018-08-04: 30 mL

## 2018-08-04 MED ORDER — CELECOXIB 200 MG PO CAPS
ORAL_CAPSULE | ORAL | Status: AC
  Administered 2018-08-04: 200 mg via ORAL
  Filled 2018-08-04: qty 1

## 2018-08-04 MED ORDER — PROPOFOL 10 MG/ML IV BOLUS
INTRAVENOUS | Status: DC | PRN
  Administered 2018-08-04: 200 mg via INTRAVENOUS

## 2018-08-04 MED ORDER — FAMOTIDINE 20 MG PO TABS
20.0000 mg | ORAL_TABLET | Freq: Once | ORAL | Status: AC
  Administered 2018-08-04: 20 mg via ORAL

## 2018-08-04 MED ORDER — DEXAMETHASONE SODIUM PHOSPHATE 10 MG/ML IJ SOLN
INTRAMUSCULAR | Status: DC | PRN
  Administered 2018-08-04: 10 mg via INTRAVENOUS

## 2018-08-04 MED ORDER — MIDAZOLAM HCL 2 MG/2ML IJ SOLN
INTRAMUSCULAR | Status: AC
  Filled 2018-08-04: qty 2

## 2018-08-04 MED ORDER — GABAPENTIN 300 MG PO CAPS
ORAL_CAPSULE | ORAL | Status: AC
  Administered 2018-08-04: 300 mg via ORAL
  Filled 2018-08-04: qty 1

## 2018-08-04 MED ORDER — CELECOXIB 200 MG PO CAPS
200.0000 mg | ORAL_CAPSULE | ORAL | Status: AC
  Administered 2018-08-04: 200 mg via ORAL

## 2018-08-04 MED ORDER — CEFAZOLIN SODIUM-DEXTROSE 2-4 GM/100ML-% IV SOLN
INTRAVENOUS | Status: AC
  Filled 2018-08-04: qty 100

## 2018-08-04 MED ORDER — MIDAZOLAM HCL 2 MG/2ML IJ SOLN
INTRAMUSCULAR | Status: DC | PRN
  Administered 2018-08-04: 2 mg via INTRAVENOUS

## 2018-08-04 MED ORDER — FENTANYL CITRATE (PF) 100 MCG/2ML IJ SOLN
INTRAMUSCULAR | Status: AC
  Filled 2018-08-04: qty 2

## 2018-08-04 MED ORDER — OXYCODONE HCL 5 MG PO TABS
5.0000 mg | ORAL_TABLET | Freq: Once | ORAL | Status: AC
  Administered 2018-08-04: 5 mg via ORAL

## 2018-08-04 MED ORDER — FAMOTIDINE 20 MG PO TABS
ORAL_TABLET | ORAL | Status: AC
  Administered 2018-08-04: 20 mg via ORAL
  Filled 2018-08-04: qty 1

## 2018-08-04 MED ORDER — ACETAMINOPHEN 500 MG PO TABS
1000.0000 mg | ORAL_TABLET | ORAL | Status: AC
  Administered 2018-08-04: 1000 mg via ORAL

## 2018-08-04 MED ORDER — BUPIVACAINE-EPINEPHRINE (PF) 0.25% -1:200000 IJ SOLN
INTRAMUSCULAR | Status: AC
  Filled 2018-08-04: qty 30

## 2018-08-04 MED ORDER — LACTATED RINGERS IV SOLN
INTRAVENOUS | Status: DC
  Administered 2018-08-04: 06:00:00 via INTRAVENOUS

## 2018-08-04 MED ORDER — PROMETHAZINE HCL 25 MG/ML IJ SOLN: 6.2500 mg | INTRAMUSCULAR | Status: DC | PRN

## 2018-08-04 MED ORDER — FENTANYL CITRATE (PF) 100 MCG/2ML IJ SOLN
INTRAMUSCULAR | Status: DC | PRN
  Administered 2018-08-04: 50 ug via INTRAVENOUS

## 2018-08-04 SURGICAL SUPPLY — 42 items
CANISTER SUCT 1200ML W/VALVE (MISCELLANEOUS) ×3 IMPLANT
CANNULA SEALS 8.5MM (CANNULA) ×2
CHLORAPREP W/TINT 26ML (MISCELLANEOUS) ×3 IMPLANT
CORD BIP STRL DISP 12FT (MISCELLANEOUS) ×3 IMPLANT
COVER WAND RF STERILE (DRAPES) ×3 IMPLANT
DEFOGGER SCOPE WARMER CLEARIFY (MISCELLANEOUS) ×3 IMPLANT
DRAPE 3 ARM ACCESS DA VINCI (DRAPES) ×2
DRAPE 3 ARM ACCESS DVNC (DRAPES) ×1 IMPLANT
DRAPE SHEET LG 3/4 BI-LAMINATE (DRAPES) ×6 IMPLANT
ELECT REM PT RETURN 9FT ADLT (ELECTROSURGICAL) ×3
ELECTRODE REM PT RTRN 9FT ADLT (ELECTROSURGICAL) ×1 IMPLANT
GLOVE BIO SURGEON STRL SZ7 (GLOVE) ×12 IMPLANT
GOWN STRL REUS W/ TWL LRG LVL3 (GOWN DISPOSABLE) ×3 IMPLANT
GOWN STRL REUS W/TWL LRG LVL3 (GOWN DISPOSABLE) ×6
GRASPER SUT TROCAR 14GX15 (MISCELLANEOUS) ×3 IMPLANT
IV NS 1000ML (IV SOLUTION) ×2
IV NS 1000ML BAXH (IV SOLUTION) ×1 IMPLANT
KIT PINK PAD W/HEAD ARE REST (MISCELLANEOUS) ×3
KIT PINK PAD W/HEAD ARM REST (MISCELLANEOUS) ×1 IMPLANT
LABEL OR SOLS (LABEL) ×3 IMPLANT
MESH VENT LT ST 11.4CM CRL (Mesh General) ×3 IMPLANT
NEEDLE HYPO 22GX1.5 SAFETY (NEEDLE) ×3 IMPLANT
PACK LAP CHOLECYSTECTOMY (MISCELLANEOUS) ×3 IMPLANT
PROGRASP ENDOWRIST DA VINCI (INSTRUMENTS) ×2
PROGRASP ENDOWRIST DVNC (INSTRUMENTS) ×1 IMPLANT
SEAL CANN 8.5 DVNC (CANNULA) ×1 IMPLANT
SLEEVE ADV FIXATION 5X100MM (TROCAR) ×3 IMPLANT
SOLUTION ELECTROLUBE (MISCELLANEOUS) ×3 IMPLANT
SPONGE LAP 18X18 RF (DISPOSABLE) ×3 IMPLANT
STRAP SAFETY 5IN WIDE (MISCELLANEOUS) ×3 IMPLANT
SUT DVC VLOC 180 0 12IN GS21 (SUTURE) ×6
SUT MNCRL 4-0 (SUTURE) ×2
SUT MNCRL 4-0 27XMFL (SUTURE) ×1
SUT VIC AB 0 CT2 27 (SUTURE) ×3 IMPLANT
SUT VICRYL 0 AB UR-6 (SUTURE) ×6 IMPLANT
SUT VLOC 90 2/L VL 12 GS22 (SUTURE) ×6 IMPLANT
SUTURE DVC VLC 180 0 12IN GS21 (SUTURE) ×2 IMPLANT
SUTURE MNCRL 4-0 27XMF (SUTURE) ×1 IMPLANT
SYR 3ML LL SCALE MARK (SYRINGE) ×3 IMPLANT
TROCAR 130MM GELPORT  DAV (MISCELLANEOUS) ×3 IMPLANT
TROCAR XCEL NON-BLD 5MMX100MML (ENDOMECHANICALS) ×3 IMPLANT
TUBING INSUF HEATED (TUBING) ×3 IMPLANT

## 2018-08-04 NOTE — Transfer of Care (Signed)
Immediate Anesthesia Transfer of Care Note  Patient: Keith Davis  Procedure(s) Performed: ROBOTIC ASSISTED LAPAROSCOPIC VENTRAL/INCISIONAL HERNIA REPAIR (N/A )  Patient Location: PACU  Anesthesia Type:General  Level of Consciousness: awake and alert   Airway & Oxygen Therapy: Patient Spontanous Breathing and Patient connected to face mask oxygen  Post-op Assessment: Report given to RN and Post -op Vital signs reviewed and stable  Post vital signs: Reviewed and stable  Last Vitals:  Vitals Value Taken Time  BP 146/62 08/04/2018  9:47 AM  Temp    Pulse 82 08/04/2018  9:48 AM  Resp    SpO2 98 % 08/04/2018  9:48 AM  Vitals shown include unvalidated device data.  Last Pain:  Vitals:   08/04/18 0721  TempSrc: Temporal  PainSc:          Complications: No apparent anesthesia complications

## 2018-08-04 NOTE — Progress Notes (Signed)
Pt arrived from OR with left hand IV catheter mostly removed from hand. Unable to reposition. IV removed. Dr. Karlton Lemon notified. Will try oral medications before reinserting an IV. Dr. Karlton Lemon acknowledged. Orders received. Abdallah Hern E 9:57 AM 08/04/2018

## 2018-08-04 NOTE — Anesthesia Post-op Follow-up Note (Signed)
Anesthesia QCDR form completed.        

## 2018-08-04 NOTE — Anesthesia Preprocedure Evaluation (Addendum)
Anesthesia Evaluation  Patient identified by MRN, date of birth, ID band Patient awake    Reviewed: Allergy & Precautions, H&P , NPO status , Patient's Chart, lab work & pertinent test results, reviewed documented beta blocker date and time   History of Anesthesia Complications (+) PROLONGED EMERGENCE, Family history of anesthesia reaction and history of anesthetic complications  Airway Mallampati: II  TM Distance: >3 FB Neck ROM: full    Dental  (+) Teeth Intact, Dental Advidsory Given   Pulmonary neg pulmonary ROS, former smoker,           Cardiovascular Exercise Tolerance: Good negative cardio ROS       Neuro/Psych PSYCHIATRIC DISORDERS Anxiety Depression negative neurological ROS     GI/Hepatic negative GI ROS, Neg liver ROS,   Endo/Other  negative endocrine ROS  Renal/GU negative Renal ROS  negative genitourinary   Musculoskeletal   Abdominal   Peds  Hematology negative hematology ROS (+)   Anesthesia Other Findings Past Medical History: No date: Anxiety     Comment:  AS A CHILD No date: Depression     Comment:  AS A CHILD No date: Family history of adverse reaction to anesthesia     Comment:  PT'S SISTER- PER PT "IT TAKES ALOT OF ANESTHESIA TO PUT               HER TO SLEEP" AND SISTER HAS WOKEN UP DURING SURGERY-  PT              HAS ONLY HAD DENTAL PROCEDURES DONE AND IT TAKES A LOT OF              NOVACAINE TO NUMB PT No date: Periumbilical hernia Past Surgical History: No date: NO PAST SURGERIES BMI    Body Mass Index:  30.85 kg/m     Reproductive/Obstetrics negative OB ROS                             Anesthesia Physical  Anesthesia Plan  ASA: II  Anesthesia Plan: General ETT   Post-op Pain Management:    Induction: Intravenous  PONV Risk Score and Plan: 3 and Ondansetron, Dexamethasone, Midazolam, Promethazine and Treatment may vary due to age or medical  condition  Airway Management Planned: Oral ETT  Additional Equipment:   Intra-op Plan:   Post-operative Plan: Extubation in OR  Informed Consent: I have reviewed the patients History and Physical, chart, labs and discussed the procedure including the risks, benefits and alternatives for the proposed anesthesia with the patient or authorized representative who has indicated his/her understanding and acceptance.     Dental Advisory Given  Plan Discussed with: CRNA  Anesthesia Plan Comments:         Anesthesia Quick Evaluation

## 2018-08-04 NOTE — Anesthesia Procedure Notes (Signed)
Procedure Name: Intubation Date/Time: 08/04/2018 7:43 AM Performed by: Philbert Riser, CRNA Pre-anesthesia Checklist: Patient identified, Emergency Drugs available, Suction available, Patient being monitored and Timeout performed Patient Re-evaluated:Patient Re-evaluated prior to induction Oxygen Delivery Method: Circle system utilized and Simple face mask Preoxygenation: Pre-oxygenation with 100% oxygen Induction Type: IV induction Ventilation: Mask ventilation without difficulty Laryngoscope Size: Mac and 3 Grade View: Grade I Tube size: 7.5 mm Number of attempts: 1 Airway Equipment and Method: Stylet Placement Confirmation: ETT inserted through vocal cords under direct vision,  positive ETCO2 and breath sounds checked- equal and bilateral Secured at: 22 cm Tube secured with: Tape Dental Injury: Teeth and Oropharynx as per pre-operative assessment

## 2018-08-04 NOTE — Discharge Instructions (Addendum)
Laparoscopic Ventral Hernia Repair Laparoscopic ventral hernia repairis a procedure to fix a bulge of tissue that pushes through a weak area of muscle in the abdomen (ventral hernia). A ventral hernia may be at the belly button (umbilical), above the belly button (epigastric), or at the incision site from previous abdominal surgery (incisional hernia). You may have this procedure as emergency surgery if part of your intestine gets trapped inside the hernia and starts to lose its blood supply (strangulation). Laparoscopic surgery is done through small incisions using a thin surgical telescope with a light and camera on the end (laparoscope). During surgery, your surgeon will use images from the laparoscope to guide the procedure. A mesh screen will be placed in the hernia to close the opening and strengthen the abdominal wall. Tell a health care provider about:  Any allergies you have.  All medicines you are taking, including vitamins, herbs, eye drops, creams, and over-the-counter medicines.  Any problems you or family members have had with anesthetic medicines.  Any blood disorders you have.  Any surgeries you have had.  Any medical conditions you have.  Whether you are pregnant or may be pregnant. What are the risks? Generally, this is a safe procedure. However, problems may occur, including:  Infection.  Bleeding.  Allergic reactions to medicines.  Damage to other structures or organs in the abdomen.  Trouble urinating or having a bowel movement after surgery.  Pneumonia.  Blood clots.  The hernia coming back after surgery.  Fluid buildup in the area of the hernia. In some cases, your health care provider may need to switch from a laparoscopic procedure to a procedure that is done through a single, larger incision in the abdomen (open procedure). You may need an open procedure if:  You have a hernia that is difficult to repair.  Your organs are hard to see.  You have  bleeding problems during the laparoscopic procedure. What happens before the procedure? Staying hydrated Follow instructions from your health care provider about hydration, which may include:  Up to 2 hours before the procedure - you may continue to drink clear liquids, such as water, clear fruit juice, black coffee, and plain tea. Eating and drinking restrictions Follow instructions from your health care provider about eating and drinking, which may include:  8 hours before the procedure - stop eating heavy meals or foods such as meat, fried foods, or fatty foods.  6 hours before the procedure - stop eating light meals or foods, such as toast or cereal.  6 hours before the procedure - stop drinking milk or drinks that contain milk.  2 hours before the procedure - stop drinking clear liquids. Medicines  Ask your health care provider about: ? Changing or stopping your regular medicines. This is especially important if you are taking diabetes medicines or blood thinners. ? Taking medicines such as aspirin and ibuprofen. These medicines can thin your blood. Do not take these medicines before your procedure if your health care provider instructs you not to.  You may be given antibiotic medicine to help prevent infection. General instructions   You may be asked to take a laxative or do an enema to empty your bowel before surgery (bowel prep).  Do not use any products that contain nicotine or tobacco, such as cigarettes and e-cigarettes. If you need help quitting, ask your health care provider.  You may need to have tests before the procedure, such as: ? Blood tests. ? Urine tests. ? Abdominal ultrasound. ?  Chest X-ray. ? Electrocardiogram (ECG).  Plan to have someone take you home from the hospital or clinic.  If you will be going home right after the procedure, plan to have someone with you for 24 hours. What happens during the procedure?  To reduce your risk of  infection: ? Your health care team will wash or sanitize their hands. ? Your skin will be washed with soap.  An IV tube will be inserted into one of your veins.  You will be given one or more of the following: ? A medicine to help you relax (sedative). ? A medicine to make you fall asleep (general anesthetic).  A small incision will be made in your abdomen. A hollow metal tube (trocar) will be placed through the incision.  A tube will be placed through the trocar to inflate your abdomen with air-like gas. This makes it easier for your surgeon to see inside your abdomen and do the repair.  The laparoscope will be inserted into your abdomen through the trocar. The laparoscope will send images to a monitor in the operating room.  Other trocars will be put through other small incisions in your abdomen. The surgical instruments needed for the procedure will be placed through these trocars.  The tissue or intestines that make up the hernia will be moved back into place.  The edges of the hernia may be stitched together.  A piece of mesh will be used to close the hernia. Stitches (sutures), clips, or staples will be used to keep the mesh in place.  A bandage (dressing) or skin glue will be put over the incisions. The procedure may vary among health care providers and hospitals. What happens after the procedure?  Your blood pressure, heart rate, breathing rate, and blood oxygen level will be monitored until the medicines you were given have worn off.  You will continue to receive fluids and medicines through an IV tube. Your IV tube will be removed when you can drink clear fluids.  You will be given pain medicine as needed.  You will be encouraged to get up and walk around as soon as possible.  You may have to wear compression stockings. These stockings help to prevent blood clots and reduce swelling in your legs.  You will be shown how to do deep breathing exercises to help prevent a  lung infection.  Do not drive for 24 hours if you were given a sedative. This information is not intended to replace advice given to you by your health care provider. Make sure you discuss any questions you have with your health care provider.  AMBULATORY SURGERY  DISCHARGE INSTRUCTIONS   1) The drugs that you were given will stay in your system until tomorrow so for the next 24 hours you should not:  A) Drive an automobile B) Make any legal decisions C) Drink any alcoholic beverage   2) You may resume regular meals tomorrow.  Today it is better to start with liquids and gradually work up to solid foods.  You may eat anything you prefer, but it is better to start with liquids, then soup and crackers, and gradually work up to solid foods.   3) Please notify your doctor immediately if you have any unusual bleeding, trouble breathing, redness and pain at the surgery site, drainage, fever, or pain not relieved by medication.    4) Additional Instructions:        Please contact your physician with any problems or Same Day Surgery  at [phone], Monday through Friday 6 am to 4 pm, or [facility] number at [phone].

## 2018-08-04 NOTE — Anesthesia Procedure Notes (Signed)
Performed by: Kyle Luppino, CRNA       

## 2018-08-04 NOTE — Anesthesia Postprocedure Evaluation (Signed)
Anesthesia Post Note  Patient: Keith Davis  Procedure(s) Performed: ROBOTIC ASSISTED LAPAROSCOPIC VENTRAL/INCISIONAL HERNIA REPAIR (N/A )  Patient location during evaluation: PACU Anesthesia Type: General Level of consciousness: awake and alert Pain management: pain level controlled Vital Signs Assessment: post-procedure vital signs reviewed and stable Respiratory status: spontaneous breathing, nonlabored ventilation, respiratory function stable and patient connected to nasal cannula oxygen Cardiovascular status: blood pressure returned to baseline and stable Postop Assessment: no apparent nausea or vomiting Anesthetic complications: no     Last Vitals:  Vitals:   08/04/18 1103 08/04/18 1148  BP: (!) 139/59 (!) 142/65  Pulse: 73 73  Resp: 16 16  Temp: 36.7 C   SpO2: 97% 97%    Last Pain:  Vitals:   08/04/18 1148  TempSrc:   PainSc: 4                  Lenard Simmer

## 2018-08-04 NOTE — Interval H&P Note (Signed)
History and Physical Interval Note:  08/04/2018 7:18 AM  Keith Davis  has presented today for surgery, with the diagnosis of VENTRAL HERNIA  The various methods of treatment have been discussed with the patient and family. After consideration of risks, benefits and other options for treatment, the patient has consented to  Procedure(s): HERNIA REPAIR INCISIONAL (N/A) ROBOTIC ASSISTED LAPAROSCOPIC VENTRAL/INCISIONAL HERNIA REPAIR (N/A) as a surgical intervention .  The patient's history has been reviewed, patient examined, no change in status, stable for surgery.  I have reviewed the patient's chart and labs.  Questions were answered to the patient's satisfaction.     Jessye Imhoff F Alin Chavira

## 2018-08-04 NOTE — Op Note (Signed)
Robotic assisted laparoscopic ventral hernia Repair IPOM using 10.4 cm round ventralight BARD mesh  Pre-operative Diagnosis: ventral hernia  Post-operative Diagnosis: same  Surgeon: Sterling Big, MD FACS  Anesthesia: Gen. with endotracheal tube   Findings: 3 cm epigastric ventral hernia Intact previous umbilical hernia repair  Estimated Blood Loss: 5cc         Complications: none             Procedure Details  The patient was seen again in the Holding Room. The benefits, complications, treatment options, and expected outcomes were discussed with the patient. The risks of bleeding, infection, recurrence of symptoms, failure to resolve symptoms, bowel injury, mesh placement, mesh infection, any of which could require further surgery were reviewed with the patient. The likelihood of improving the patient's symptoms with return to their baseline status is good.  The patient and/or family concurred with the proposed plan, giving informed consent.  The patient was taken to Operating Room, identified as Keith Davis and the procedure verified.  A Time Out was held and the above information confirmed.  Prior to the induction of general anesthesia, antibiotic prophylaxis was administered. VTE prophylaxis was in place. General endotracheal anesthesia was then administered and tolerated well. After the induction, the abdomen was prepped with Chloraprep and draped in the sterile fashion. The patient was positioned in the supine position.  We used a left upper quadrant subcostal incision and using a cutdown technique were able to identify the fascia both anteriorly and posteriorly elevated incised and 2 stay sutures were placed.  Hassan trocar inserted and pneumoperitoneum obtained.  No hemodynamic compromise.   2 additional 8 mm ports were placed under direct visualization.  I visualized the hernia and there was an epigastric ventral hernia measuring approximately 3 cm.  At this time I went ahead and  inserted the 10.4 round mesh with an echo location.  The robot was brought to the surgical field and docked in the standard fashion.  We made sure that all instrumentation was kept under direct vision at all times and there was no collision between the arms.  I scrubbed out and went to the console.  Confirm and measured that the defect was 3 cm.  We also saw previous stitches away from the epigastric defect.  Using a 0V lock suture we closed the ventral defect primarily.  The PMI was used to pierce the defect in the center.  Using the mesh and the echo location device were able to bring the mesh towards the abdominal wall. Falciform was taken down with electrocautery to allow adequate mesh placement.    The mesh was secured circumferentially to the abdominal wall using 2 OV lock in the standard fashion.  The mesh layed really nicely against the  abdominal wall. A second look laparoscopy revealed no evidence of intra-abdominal injury.   All the needles and foreign objects were removed under direct visualization.  The instruments were removed and the robot was undocked.  I scrubbed back in, The laparoscopic ports were removed under direct visualization and the pneumoperitoneum was deflated.  Incisions were closed with  4-0 Monocryl  And the fascial sutures approximated in the standard fashion Dermabond was used to coat the skin. Marcaine quarter percent with epinephrine and lidocaine 1% was used to inject all the incision sites. Patient tolerated procedure well and there were no immediate complications. Needle and laparotomy counts were correct   Sterling Big, MD, FACS

## 2018-08-17 ENCOUNTER — Other Ambulatory Visit: Payer: Self-pay

## 2018-08-17 ENCOUNTER — Encounter: Payer: Self-pay | Admitting: Surgery

## 2018-08-17 ENCOUNTER — Ambulatory Visit (INDEPENDENT_AMBULATORY_CARE_PROVIDER_SITE_OTHER): Payer: Self-pay | Admitting: Surgery

## 2018-08-17 VITALS — BP 115/71 | HR 61 | Temp 98.1°F | Resp 12 | Ht 70.0 in | Wt 215.0 lb

## 2018-08-17 DIAGNOSIS — Z09 Encounter for follow-up examination after completed treatment for conditions other than malignant neoplasm: Secondary | ICD-10-CM

## 2018-08-17 NOTE — Progress Notes (Signed)
S/p Rob ventral hernia. No pain Only taking Prn tylenol Used narcotics for 3 days post op Taking PO, no fevers  PE NAD Abd: soft, nt. Incision c/d/i. No infection or recurrence  A/P Doing very well No heavy lifting RTC prn

## 2018-08-17 NOTE — Patient Instructions (Signed)
The patient is aware to call back for any questions or new concerns.  GENERAL POST-OPERATIVE PATIENT INSTRUCTIONS   WOUND CARE INSTRUCTIONS:  Keep a dry clean dressing on the wound if there is drainage. The initial bandage may be removed after 24 hours.  Once the wound has quit draining you may leave it open to air.  If clothing rubs against the wound or causes irritation and the wound is not draining you may cover it with a dry dressing during the daytime.  Try to keep the wound dry and avoid ointments on the wound unless directed to do so.  If the wound becomes bright red and painful or starts to drain infected material that is not clear, please contact your physician immediately.  If the wound is mildly pink and has a thick firm ridge underneath it, this is normal, and is referred to as a healing ridge.  This will resolve over the next 4-6 weeks.  BATHING: You may shower if you have been informed of this by your surgeon. However, Please do not submerge in a tub, hot tub, or pool until incisions are completely sealed or have been told by your surgeon that you may do so.  DIET:  You may eat any foods that you can tolerate.  It is a good idea to eat a high fiber diet and take in plenty of fluids to prevent constipation.  If you do become constipated you may want to take a mild laxative or take ducolax tablets on a daily basis until your bowel habits are regular.  Constipation can be very uncomfortable, along with straining, after recent surgery.  ACTIVITY:  You are encouraged to cough and deep breath or use your incentive spirometer if you were given one, every 15-30 minutes when awake.  This will help prevent respiratory complications and low grade fevers post-operatively if you had a general anesthetic.  You may want to hug a pillow when coughing and sneezing to add additional support to the surgical area, if you had abdominal or chest surgery, which will decrease pain during these times.  You are  encouraged to walk and engage in light activity for the next two weeks.   Twenty pounds is roughly equivalent to a plastic bag of groceries. At that time- Listen to your body when lifting, if you have pain when lifting, stop and then try again in a few days. Soreness after doing exercises or activities of daily living is normal as you get back in to your normal routine.  MEDICATIONS:  Try to take narcotic medications and anti-inflammatory medications, such as tylenol, ibuprofen, naprosyn, etc., with food.  This will minimize stomach upset from the medication.  Should you develop nausea and vomiting from the pain medication, or develop a rash, please discontinue the medication and contact your physician.  You should not drive, make important decisions, or operate machinery when taking narcotic pain medication.  SUNBLOCK Use sun block to incision area over the next year if this area will be exposed to sun. This helps decrease scarring and will allow you avoid a permanent darkened area over your incision.  QUESTIONS:  Please feel free to call our office if you have any questions, and we will be glad to assist you.  

## 2020-08-26 ENCOUNTER — Other Ambulatory Visit: Payer: Self-pay

## 2020-08-26 ENCOUNTER — Emergency Department
Admission: EM | Admit: 2020-08-26 | Discharge: 2020-08-26 | Disposition: A | Discharge status: Home / Self Care | Payer: Medicaid Other | Attending: Emergency Medicine | Admitting: Emergency Medicine

## 2020-08-26 ENCOUNTER — Emergency Department: Payer: Medicaid Other

## 2020-08-26 ENCOUNTER — Encounter: Payer: Self-pay | Admitting: Emergency Medicine

## 2020-08-26 DIAGNOSIS — Y92410 Unspecified street and highway as the place of occurrence of the external cause: Secondary | ICD-10-CM | POA: Insufficient documentation

## 2020-08-26 DIAGNOSIS — Z87891 Personal history of nicotine dependence: Secondary | ICD-10-CM | POA: Insufficient documentation

## 2020-08-26 DIAGNOSIS — M7918 Myalgia, other site: Secondary | ICD-10-CM

## 2020-08-26 DIAGNOSIS — M79642 Pain in left hand: Secondary | ICD-10-CM | POA: Insufficient documentation

## 2020-08-26 DIAGNOSIS — S0083XA Contusion of other part of head, initial encounter: Secondary | ICD-10-CM | POA: Diagnosis not present

## 2020-08-26 DIAGNOSIS — S0993XA Unspecified injury of face, initial encounter: Secondary | ICD-10-CM | POA: Diagnosis present

## 2020-08-26 DIAGNOSIS — M545 Low back pain, unspecified: Secondary | ICD-10-CM | POA: Diagnosis not present

## 2020-08-26 MED ORDER — NAPROXEN 500 MG PO TABS: 500.0000 mg | ORAL_TABLET | Freq: Two times a day (BID) | ORAL | Status: AC

## 2020-08-26 MED ORDER — ORPHENADRINE CITRATE ER 100 MG PO TB12: 100.0000 mg | ORAL_TABLET | Freq: Two times a day (BID) | ORAL | 0 refills | Status: AC

## 2020-08-26 NOTE — ED Notes (Signed)
See triage note.  Says pain left elbow and hand and behind left knee and says his big toe tingled when right foot lifted. In nad.  On stretcher. Has not been to xray yet.
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
## 2020-08-26 NOTE — Discharge Instructions (Signed)
No acute findings on CT and x-rays.  Follow discharge care instruction take medication as directed.  Be advised muscle relaxants may cause drowsiness.
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
